# Patient Record
Sex: Female | Born: 1960 | Race: White | Hispanic: No | Smoking: Never smoker
Health system: Southern US, Community
[De-identification: ages and names within clinical notes are randomized; demographics above are authoritative.]

## PROBLEM LIST (undated history)

## (undated) DIAGNOSIS — Z9889 Other specified postprocedural states: Secondary | ICD-10-CM

## (undated) DIAGNOSIS — IMO0001 Reserved for inherently not codable concepts without codable children: Secondary | ICD-10-CM

## (undated) DIAGNOSIS — M1990 Unspecified osteoarthritis, unspecified site: Secondary | ICD-10-CM

## (undated) DIAGNOSIS — R519 Headache, unspecified: Secondary | ICD-10-CM

## (undated) DIAGNOSIS — K219 Gastro-esophageal reflux disease without esophagitis: Secondary | ICD-10-CM

## (undated) DIAGNOSIS — R51 Headache: Secondary | ICD-10-CM

## (undated) DIAGNOSIS — R112 Nausea with vomiting, unspecified: Secondary | ICD-10-CM

## (undated) DIAGNOSIS — T4145XA Adverse effect of unspecified anesthetic, initial encounter: Secondary | ICD-10-CM

## (undated) DIAGNOSIS — I499 Cardiac arrhythmia, unspecified: Secondary | ICD-10-CM

## (undated) DIAGNOSIS — T8859XA Other complications of anesthesia, initial encounter: Secondary | ICD-10-CM

## (undated) DIAGNOSIS — J40 Bronchitis, not specified as acute or chronic: Secondary | ICD-10-CM

## (undated) DIAGNOSIS — Z9189 Other specified personal risk factors, not elsewhere classified: Secondary | ICD-10-CM

## (undated) DIAGNOSIS — Z889 Allergy status to unspecified drugs, medicaments and biological substances status: Secondary | ICD-10-CM

## (undated) DIAGNOSIS — D649 Anemia, unspecified: Secondary | ICD-10-CM

## (undated) HISTORY — DX: Unspecified osteoarthritis, unspecified site: M19.90

## (undated) HISTORY — PX: INTRAUTERINE DEVICE INSERTION: SHX323

---

## 1998-04-02 ENCOUNTER — Other Ambulatory Visit: Admission: RE | Admit: 1998-04-02 | Discharge: 1998-04-02 | Payer: Self-pay | Admitting: *Deleted

## 1999-04-22 ENCOUNTER — Other Ambulatory Visit: Admission: RE | Admit: 1999-04-22 | Discharge: 1999-04-22 | Payer: Self-pay | Admitting: *Deleted

## 2000-08-28 ENCOUNTER — Other Ambulatory Visit: Admission: RE | Admit: 2000-08-28 | Discharge: 2000-08-28 | Payer: Self-pay | Admitting: *Deleted

## 2001-10-07 ENCOUNTER — Other Ambulatory Visit: Admission: RE | Admit: 2001-10-07 | Discharge: 2001-10-07 | Payer: Self-pay | Admitting: *Deleted

## 2002-03-22 ENCOUNTER — Encounter: Admission: RE | Admit: 2002-03-22 | Discharge: 2002-03-22 | Payer: Self-pay | Admitting: *Deleted

## 2002-03-22 ENCOUNTER — Encounter: Payer: Self-pay | Admitting: *Deleted

## 2004-03-11 ENCOUNTER — Other Ambulatory Visit: Admission: RE | Admit: 2004-03-11 | Discharge: 2004-03-11 | Payer: Self-pay | Admitting: *Deleted

## 2005-08-20 ENCOUNTER — Other Ambulatory Visit: Admission: RE | Admit: 2005-08-20 | Discharge: 2005-08-20 | Payer: Self-pay | Admitting: Obstetrics and Gynecology

## 2006-04-29 ENCOUNTER — Encounter: Admission: RE | Admit: 2006-04-29 | Discharge: 2006-04-29 | Payer: Self-pay | Admitting: Obstetrics and Gynecology

## 2007-12-30 HISTORY — PX: FRACTURE SURGERY: SHX138

## 2008-06-29 ENCOUNTER — Encounter: Admission: RE | Admit: 2008-06-29 | Discharge: 2008-06-29 | Payer: Self-pay | Admitting: Obstetrics and Gynecology

## 2009-10-09 ENCOUNTER — Encounter: Admission: RE | Admit: 2009-10-09 | Discharge: 2009-10-09 | Payer: Self-pay | Admitting: Obstetrics and Gynecology

## 2010-02-19 ENCOUNTER — Encounter: Admission: RE | Admit: 2010-02-19 | Discharge: 2010-02-19 | Payer: Self-pay | Admitting: Family Medicine

## 2010-03-31 ENCOUNTER — Emergency Department (HOSPITAL_COMMUNITY): Admission: EM | Admit: 2010-03-31 | Discharge: 2010-03-31 | Payer: Self-pay | Admitting: Emergency Medicine

## 2010-12-02 ENCOUNTER — Observation Stay (HOSPITAL_COMMUNITY)
Admission: EM | Admit: 2010-12-02 | Discharge: 2010-12-03 | Payer: Self-pay | Source: Home / Self Care | Attending: Internal Medicine | Admitting: Internal Medicine

## 2010-12-03 ENCOUNTER — Encounter: Payer: Self-pay | Admitting: Internal Medicine

## 2011-03-11 LAB — POCT CARDIAC MARKERS
Myoglobin, poc: 29.2 ng/mL (ref 12–200)
Troponin i, poc: <0.05 ng/mL (ref 0.00–0.09)

## 2011-03-11 LAB — CBC
MCH: 32.6 pg (ref 26.0–34.0)
MCHC: 33.8 g/dL (ref 30.0–36.0)

## 2011-03-11 LAB — DIFFERENTIAL
Basophils Relative: 1 % (ref 0–1)
Eosinophils Absolute: 0.1 10*3/uL (ref 0.0–0.7)
Lymphocytes Relative: 18 % (ref 12–46)
Monocytes Absolute: 0.3 10*3/uL (ref 0.1–1.0)
Neutro Abs: 3.8 10*3/uL (ref 1.7–7.7)
Neutrophils Relative %: 74 % (ref 43–77)

## 2011-03-11 LAB — POCT I-STAT, CHEM 8
BUN: 16 mg/dL (ref 6–23)
Chloride: 105 mEq/L (ref 96–112)
HCT: 41 % (ref 36.0–46.0)
Hemoglobin: 13.9 g/dL (ref 12.0–15.0)
Potassium: 3.8 mEq/L (ref 3.5–5.1)
TCO2: 25 mmol/L (ref 0–100)

## 2011-03-11 LAB — TROPONIN I: Troponin I: 0.01 ng/mL (ref 0.00–0.06)

## 2011-03-11 LAB — CARDIAC PANEL(CRET KIN+CKTOT+MB+TROPI)
CK, MB: 0.5 ng/mL (ref 0.3–4.0)
Total CK: 36 U/L (ref 7–177)

## 2011-03-11 LAB — CK TOTAL AND CKMB (NOT AT ARMC)
CK, MB: 0.5 ng/mL (ref 0.3–4.0)
Relative Index: INVALID (ref 0.0–2.5)
Total CK: 38 U/L (ref 7–177)

## 2011-09-15 ENCOUNTER — Other Ambulatory Visit: Payer: Self-pay | Admitting: Family Medicine

## 2011-09-15 ENCOUNTER — Other Ambulatory Visit: Payer: Self-pay | Admitting: Rehabilitation

## 2011-09-15 ENCOUNTER — Ambulatory Visit
Admission: RE | Admit: 2011-09-15 | Discharge: 2011-09-15 | Disposition: A | Discharge status: Home / Self Care | Payer: BC Managed Care – PPO | Source: Ambulatory Visit | Attending: Family Medicine | Admitting: Family Medicine

## 2011-10-13 ENCOUNTER — Other Ambulatory Visit: Payer: Self-pay | Admitting: Obstetrics and Gynecology

## 2011-10-14 ENCOUNTER — Other Ambulatory Visit: Payer: Self-pay | Admitting: Obstetrics and Gynecology

## 2011-10-14 DIAGNOSIS — Z1231 Encounter for screening mammogram for malignant neoplasm of breast: Secondary | ICD-10-CM

## 2011-10-21 ENCOUNTER — Ambulatory Visit
Admission: RE | Admit: 2011-10-21 | Discharge: 2011-10-21 | Disposition: A | Discharge status: Home / Self Care | Payer: BC Managed Care – PPO | Source: Ambulatory Visit | Attending: Obstetrics and Gynecology | Admitting: Obstetrics and Gynecology

## 2011-10-21 DIAGNOSIS — Z1231 Encounter for screening mammogram for malignant neoplasm of breast: Secondary | ICD-10-CM

## 2012-12-01 ENCOUNTER — Institutional Professional Consult (permissible substitution): Payer: BC Managed Care – PPO | Admitting: Cardiovascular Disease

## 2013-08-22 ENCOUNTER — Other Ambulatory Visit: Payer: Self-pay | Admitting: Obstetrics and Gynecology

## 2013-08-22 DIAGNOSIS — N644 Mastodynia: Secondary | ICD-10-CM

## 2013-10-10 ENCOUNTER — Other Ambulatory Visit: Payer: Self-pay

## 2013-10-10 DIAGNOSIS — Z1231 Encounter for screening mammogram for malignant neoplasm of breast: Secondary | ICD-10-CM

## 2013-11-03 ENCOUNTER — Ambulatory Visit
Admission: RE | Admit: 2013-11-03 | Discharge: 2013-11-03 | Disposition: A | Discharge status: Home / Self Care | Payer: BC Managed Care – PPO | Source: Ambulatory Visit

## 2013-11-03 DIAGNOSIS — Z1231 Encounter for screening mammogram for malignant neoplasm of breast: Secondary | ICD-10-CM

## 2014-07-12 ENCOUNTER — Other Ambulatory Visit: Payer: Self-pay | Admitting: Obstetrics and Gynecology

## 2014-07-12 DIAGNOSIS — N644 Mastodynia: Secondary | ICD-10-CM

## 2014-07-13 ENCOUNTER — Other Ambulatory Visit: Payer: Self-pay | Admitting: Obstetrics and Gynecology

## 2014-07-13 DIAGNOSIS — N644 Mastodynia: Secondary | ICD-10-CM

## 2014-08-03 ENCOUNTER — Other Ambulatory Visit: Payer: Self-pay | Admitting: Obstetrics and Gynecology

## 2014-08-03 ENCOUNTER — Ambulatory Visit
Admission: RE | Admit: 2014-08-03 | Discharge: 2014-08-03 | Disposition: A | Discharge status: Home / Self Care | Payer: BC Managed Care – PPO | Source: Ambulatory Visit | Attending: Obstetrics and Gynecology | Admitting: Obstetrics and Gynecology

## 2014-08-03 DIAGNOSIS — N644 Mastodynia: Secondary | ICD-10-CM

## 2014-10-06 ENCOUNTER — Ambulatory Visit (INDEPENDENT_AMBULATORY_CARE_PROVIDER_SITE_OTHER): Payer: Self-pay | Admitting: Surgery

## 2014-10-10 ENCOUNTER — Encounter: Payer: Self-pay | Admitting: *Deleted

## 2014-10-20 NOTE — Pre-Procedure Instructions (Signed)
Jenna Morgan  10/20/2014   Your procedure is scheduled on:  Wednesday, November 01, 2014  [facility] at 8:15 AM.  Call this number if you have problems the morning of surgery: [phone]   Remember:   Do not eat food or drink liquids after midnight Tuesday, October 31, 2014   Take these medicines the morning of surgery with A SIP OF WATER: ALPRAZolam Prudy Feeler(XANAX) if needed for anxiety, fluticasone (FLONASE) 50 MCG/ACT nasal spray, Claritin and Mucinex.  Stop taking Aspirin, vitamins, and herbal medications. Do not take any NSAIDs ie: Ibuprofen, Advil, Naproxen or any medication containing Aspirin; stop 5 days prior to procedure ( Friday, October 27, 2014).   Do not wear jewelry, make-up or nail polish.  Do not wear lotions, powders, or perfumes. You may not wear deodorant.  Do not shave 48 hours prior to surgery.  Do not bring valuables to the hospital.  [facility] is not responsible for any belongings or valuables.               Contacts, dentures or bridgework may not be worn into surgery.  Leave suitcase in the car. After surgery it may be brought to your room.  For patients admitted to the hospital, discharge time is determined by your treatment team.               Patients discharged the day of surgery will not be allowed to drive home.  Name and phone number of your driver:   Special Instructions:  Special Instructions:Special Instructions: [facility] - Preparing for Surgery  Before surgery, you can play an important role.  Because skin is not sterile, your skin needs to be as free of germs as possible.  You can reduce the number of germs on you skin by washing with CHG (chlorahexidine gluconate) soap before surgery.  CHG is an antiseptic cleaner which kills germs and bonds with the skin to continue killing germs even after washing.  Please DO NOT use if you have an allergy to CHG or antibacterial soaps.  If your skin becomes reddened/irritated stop  using the CHG and inform your nurse when you arrive at Short Stay.  Do not shave (including legs and underarms) for at least 48 hours prior to the first CHG shower.  You may shave your face.  Please follow these instructions carefully:   1.  Shower with CHG Soap the night before surgery and the morning of Surgery.  2.  If you choose to wash your hair, wash your hair first as usual with your normal shampoo.  3.  After you shampoo, rinse your hair and body thoroughly to remove the Shampoo.  4.  Use CHG as you would any other liquid soap.  You can apply chg directly  to the skin and wash gently with scrungie or a clean washcloth.  5.  Apply the CHG Soap to your body ONLY FROM THE NECK DOWN.  Do not use on open wounds or open sores.  Avoid contact with your eyes, ears, mouth and genitals (private parts).  Wash genitals (private parts) with your normal soap.  6.  Wash thoroughly, paying special attention to the area where your surgery will be performed.  7.  Thoroughly rinse your body with warm water from the neck down.  8.  DO NOT shower/wash with your normal soap after using and rinsing off the CHG Soap.  9.  Pat yourself dry with a clean towel.  10.  Wear clean pajamas.            11.  Place clean sheets on your bed the night of your first shower and do not sleep with pets.  Day of Surgery  Do not apply any lotions/deodorants the morning of surgery.  Please wear clean clothes to the hospital/surgery center.   Please read over the following fact sheets that you were given: Pain Booklet, Coughing and Deep Breathing and Surgical Site Infection Prevention

## 2014-10-23 ENCOUNTER — Encounter (HOSPITAL_COMMUNITY): Payer: Self-pay | Admitting: Pharmacy Technician

## 2014-10-23 ENCOUNTER — Encounter (HOSPITAL_COMMUNITY)
Admission: RE | Admit: 2014-10-23 | Discharge: 2014-10-23 | Disposition: A | Discharge status: Home / Self Care | Payer: BC Managed Care – PPO | Source: Ambulatory Visit | Attending: Anesthesiology | Admitting: Anesthesiology

## 2014-10-23 ENCOUNTER — Encounter (HOSPITAL_COMMUNITY)
Admission: RE | Admit: 2014-10-23 | Discharge: 2014-10-23 | Disposition: A | Discharge status: Home / Self Care | Payer: BC Managed Care – PPO | Source: Ambulatory Visit | Attending: Surgery | Admitting: Surgery

## 2014-10-23 ENCOUNTER — Encounter (HOSPITAL_COMMUNITY): Payer: Self-pay

## 2014-10-23 DIAGNOSIS — K219 Gastro-esophageal reflux disease without esophagitis: Secondary | ICD-10-CM | POA: Diagnosis not present

## 2014-10-23 DIAGNOSIS — R000 Tachycardia, unspecified: Secondary | ICD-10-CM | POA: Diagnosis not present

## 2014-10-23 DIAGNOSIS — M1990 Unspecified osteoarthritis, unspecified site: Secondary | ICD-10-CM | POA: Diagnosis not present

## 2014-10-23 DIAGNOSIS — Z01818 Encounter for other preprocedural examination: Secondary | ICD-10-CM | POA: Insufficient documentation

## 2014-10-23 DIAGNOSIS — I447 Left bundle-branch block, unspecified: Secondary | ICD-10-CM | POA: Insufficient documentation

## 2014-10-23 HISTORY — DX: Nausea with vomiting, unspecified: R11.2

## 2014-10-23 HISTORY — DX: Headache: R51

## 2014-10-23 HISTORY — DX: Adverse effect of unspecified anesthetic, initial encounter: T41.45XA

## 2014-10-23 HISTORY — DX: Other complications of anesthesia, initial encounter: T88.59XA

## 2014-10-23 HISTORY — DX: Gastro-esophageal reflux disease without esophagitis: K21.9

## 2014-10-23 HISTORY — DX: Reserved for inherently not codable concepts without codable children: IMO0001

## 2014-10-23 HISTORY — DX: Headache, unspecified: R51.9

## 2014-10-23 HISTORY — DX: Allergy status to unspecified drugs, medicaments and biological substances: Z88.9

## 2014-10-23 HISTORY — DX: Bronchitis, not specified as acute or chronic: J40

## 2014-10-23 HISTORY — DX: Anemia, unspecified: D64.9

## 2014-10-23 HISTORY — DX: Cardiac arrhythmia, unspecified: I49.9

## 2014-10-23 HISTORY — DX: Other specified personal risk factors, not elsewhere classified: Z91.89

## 2014-10-23 HISTORY — DX: Other specified postprocedural states: Z98.890

## 2014-10-23 LAB — BASIC METABOLIC PANEL
ANION GAP: 11 (ref 5–15)
BUN: 19 mg/dL (ref 6–23)
CHLORIDE: 105 meq/L (ref 96–112)
CO2: 26 meq/L (ref 19–32)
Calcium: 9.6 mg/dL (ref 8.4–10.5)
Creatinine, Ser: 0.83 mg/dL (ref 0.50–1.10)
GFR calc Af Amer: >90 mL/min (ref >90)
GFR calc non Af Amer: 81 mL/min — ABNORMAL LOW (ref >90)
Glucose, Bld: 53 mg/dL — ABNORMAL LOW (ref 70–99)
POTASSIUM: 4.2 meq/L (ref 3.7–5.3)
Sodium: 142 mEq/L (ref 137–147)

## 2014-10-23 LAB — CBC
HEMATOCRIT: 39.9 % (ref 36.0–46.0)
HEMOGLOBIN: 13.3 g/dL (ref 12.0–15.0)
MCH: 32.6 pg (ref 26.0–34.0)
MCHC: 33.3 g/dL (ref 30.0–36.0)
MCV: 97.8 fL (ref 78.0–100.0)
Platelets: 288 10*3/uL (ref 150–400)
RBC: 4.08 MIL/uL (ref 3.87–5.11)
RDW: 11.8 % (ref 11.5–15.5)
WBC: 4.4 10*3/uL (ref 4.0–10.5)

## 2014-10-23 LAB — HCG, SERUM, QUALITATIVE: PREG SERUM: NEGATIVE

## 2014-10-23 MED ORDER — CHLORHEXIDINE GLUCONATE 4 % EX LIQD: 1.0000 "application " | Freq: Once | CUTANEOUS | Status: DC

## 2014-10-24 ENCOUNTER — Encounter (HOSPITAL_COMMUNITY): Payer: Self-pay | Admitting: Vascular Surgery

## 2014-10-24 NOTE — Progress Notes (Signed)
Anesthesia Chart Review:  Patient is a 53 year old female scheduled for left excisional breast biopsy on 11/01/14 by Dr. Corliss Skainssuei.  History includes non-smoker, left BBB, tachycardia, post-operative N/V, GERD, osteoarthritis. PCP is listed as Dr. Catha GosselinKevin Little. She was seen by cardiologist Dr. Peter SwazilandJordan in 11/2010 during a hospitalization for atypical chest pain with new onset left BBB.  She had a normal stress test then.   EKG on 10/23/14 showed: NSR, left BBB.  No significant change since last tracing on 12/02/10.  She had a normal Lexiscan study, EF 59% on 12/03/10.  Echo on 12/03/10: Normal LV cavity size and thickness. Normal LV systolic function, EF 55-60%. Normal wall motion, no regional wall motion abnormalities. Ventricular septum: Septal motion showed paradox.  No ASD or PFO noted.    Preoperative CXR and labs noted.   Patient with known chronic left BBB with previous unremarkable cardiac work-up.  If no acute changes or new CV symptoms then I would anticipate that she could proceed as planned.  Velna Ochsllison Aideen Fenster, PA-C [facility]/Anesthesiology Phone [phone] 10/24/2014 3:52 PM

## 2014-10-27 ENCOUNTER — Telehealth (INDEPENDENT_AMBULATORY_CARE_PROVIDER_SITE_OTHER): Payer: Self-pay

## 2014-10-27 NOTE — Telephone Encounter (Signed)
Pt called to let Dr Corliss Skainssuei that her breast mass, is still there however it has gotten 85% better and she wanted to know if she still needed to have her sx. Pt is scheduled to have sx on 11/4.  Pt states that the area is not very painful any longer. Informed pt that I would send Dr Corliss Skainssuei a message and we would contact her as soon as we recieve a message. Will put this message in allscripts as well

## 2014-10-30 ENCOUNTER — Other Ambulatory Visit: Payer: Self-pay | Admitting: Obstetrics and Gynecology

## 2014-10-30 DIAGNOSIS — Z1231 Encounter for screening mammogram for malignant neoplasm of breast: Secondary | ICD-10-CM

## 2014-11-01 ENCOUNTER — Ambulatory Visit (HOSPITAL_COMMUNITY): Admission: RE | Admit: 2014-11-01 | Payer: BC Managed Care – PPO | Source: Ambulatory Visit | Admitting: Surgery

## 2014-11-01 ENCOUNTER — Encounter (HOSPITAL_COMMUNITY): Admission: RE | Payer: Self-pay | Source: Ambulatory Visit

## 2014-11-01 SURGERY — EXCISION OF BREAST BIOPSY
Anesthesia: General | Laterality: Left

## 2014-11-08 ENCOUNTER — Inpatient Hospital Stay: Admission: RE | Admit: 2014-11-08 | Payer: BC Managed Care – PPO | Source: Ambulatory Visit

## 2014-11-08 ENCOUNTER — Other Ambulatory Visit: Payer: Self-pay | Admitting: Obstetrics and Gynecology

## 2014-11-08 DIAGNOSIS — Z1231 Encounter for screening mammogram for malignant neoplasm of breast: Secondary | ICD-10-CM

## 2014-11-16 ENCOUNTER — Other Ambulatory Visit: Payer: Self-pay | Admitting: Family Medicine

## 2014-11-16 DIAGNOSIS — N63 Unspecified lump in unspecified breast: Secondary | ICD-10-CM

## 2014-12-07 ENCOUNTER — Ambulatory Visit
Admission: RE | Admit: 2014-12-07 | Discharge: 2014-12-07 | Disposition: A | Discharge status: Home / Self Care | Payer: BC Managed Care – PPO | Source: Ambulatory Visit | Attending: Family Medicine | Admitting: Family Medicine

## 2014-12-07 DIAGNOSIS — N63 Unspecified lump in unspecified breast: Secondary | ICD-10-CM

## 2016-03-17 ENCOUNTER — Other Ambulatory Visit: Payer: Self-pay

## 2016-03-17 DIAGNOSIS — Z1231 Encounter for screening mammogram for malignant neoplasm of breast: Secondary | ICD-10-CM

## 2016-04-09 ENCOUNTER — Other Ambulatory Visit: Payer: Self-pay

## 2016-04-09 ENCOUNTER — Ambulatory Visit
Admission: RE | Admit: 2016-04-09 | Discharge: 2016-04-09 | Disposition: A | Discharge status: Home / Self Care | Payer: BC Managed Care – PPO | Source: Ambulatory Visit

## 2016-04-09 DIAGNOSIS — Z1231 Encounter for screening mammogram for malignant neoplasm of breast: Secondary | ICD-10-CM

## 2017-06-10 ENCOUNTER — Other Ambulatory Visit: Payer: Self-pay | Admitting: Obstetrics and Gynecology

## 2017-06-10 DIAGNOSIS — Z1231 Encounter for screening mammogram for malignant neoplasm of breast: Secondary | ICD-10-CM

## 2017-07-07 ENCOUNTER — Encounter (INDEPENDENT_AMBULATORY_CARE_PROVIDER_SITE_OTHER): Payer: Self-pay

## 2017-07-07 ENCOUNTER — Ambulatory Visit
Admission: RE | Admit: 2017-07-07 | Discharge: 2017-07-07 | Disposition: A | Discharge status: Home / Self Care | Payer: BC Managed Care – PPO | Source: Ambulatory Visit | Attending: Obstetrics and Gynecology | Admitting: Obstetrics and Gynecology

## 2017-07-07 DIAGNOSIS — Z1231 Encounter for screening mammogram for malignant neoplasm of breast: Secondary | ICD-10-CM

## 2018-08-10 ENCOUNTER — Other Ambulatory Visit: Payer: Self-pay | Admitting: Obstetrics and Gynecology

## 2018-08-10 DIAGNOSIS — Z1231 Encounter for screening mammogram for malignant neoplasm of breast: Secondary | ICD-10-CM

## 2018-10-06 ENCOUNTER — Ambulatory Visit
Admission: RE | Admit: 2018-10-06 | Discharge: 2018-10-06 | Disposition: A | Discharge status: Home / Self Care | Payer: BC Managed Care – PPO | Source: Ambulatory Visit | Attending: Obstetrics and Gynecology | Admitting: Obstetrics and Gynecology

## 2018-10-06 DIAGNOSIS — Z1231 Encounter for screening mammogram for malignant neoplasm of breast: Secondary | ICD-10-CM

## 2019-06-10 ENCOUNTER — Other Ambulatory Visit: Payer: Self-pay | Admitting: Orthopedic Surgery

## 2019-06-10 DIAGNOSIS — G8929 Other chronic pain: Secondary | ICD-10-CM

## 2019-07-04 ENCOUNTER — Other Ambulatory Visit: Payer: BC Managed Care – PPO

## 2020-01-25 ENCOUNTER — Other Ambulatory Visit: Payer: Self-pay | Admitting: Obstetrics and Gynecology

## 2020-01-25 DIAGNOSIS — Z1231 Encounter for screening mammogram for malignant neoplasm of breast: Secondary | ICD-10-CM

## 2020-01-27 ENCOUNTER — Other Ambulatory Visit: Payer: Self-pay | Admitting: Family Medicine

## 2020-01-27 DIAGNOSIS — Z8249 Family history of ischemic heart disease and other diseases of the circulatory system: Secondary | ICD-10-CM

## 2020-02-03 ENCOUNTER — Other Ambulatory Visit: Payer: BC Managed Care – PPO

## 2020-03-01 ENCOUNTER — Ambulatory Visit: Payer: BC Managed Care – PPO

## 2020-03-02 ENCOUNTER — Encounter: Payer: Self-pay | Admitting: Neurology

## 2020-03-05 ENCOUNTER — Other Ambulatory Visit: Payer: Self-pay

## 2020-03-05 ENCOUNTER — Ambulatory Visit
Admission: RE | Admit: 2020-03-05 | Discharge: 2020-03-05 | Disposition: A | Discharge status: Home / Self Care | Payer: BC Managed Care – PPO | Source: Ambulatory Visit | Attending: Obstetrics and Gynecology | Admitting: Obstetrics and Gynecology

## 2020-03-05 DIAGNOSIS — Z1231 Encounter for screening mammogram for malignant neoplasm of breast: Secondary | ICD-10-CM

## 2020-03-15 ENCOUNTER — Encounter: Payer: Self-pay | Admitting: *Deleted

## 2020-03-16 ENCOUNTER — Encounter: Payer: Self-pay | Admitting: Neurology

## 2020-03-16 ENCOUNTER — Other Ambulatory Visit: Payer: Self-pay

## 2020-03-16 ENCOUNTER — Ambulatory Visit: Payer: BC Managed Care – PPO | Admitting: Neurology

## 2020-03-16 VITALS — BP 114/71 | HR 82 | Temp 97.8°F | Ht 67.5 in | Wt 177.0 lb

## 2020-03-16 DIAGNOSIS — G514 Facial myokymia: Secondary | ICD-10-CM | POA: Diagnosis not present

## 2020-03-16 NOTE — Progress Notes (Signed)
PATIENT: Jenna Morgan DOB: Jan 29, 1961  Chief Complaint  Patient presents with  . New Patient (Initial Visit)    pt alone, rm 4. pt states that when she smiles a certain way her face will tremor. she noticed this about a month ago.   . Other    Referring  NP Christa See, NP working with Dr Rex Kras.     HISTORICAL  Jenna Morgan is a 59 year old female, seen in request by her primary care physician Dr. Hulan Fess for evaluation of bilateral facial muscle tremor 1 small in certain position, initial evaluation was on March 16, 2020.  I have reviewed and summarized the referring note from the referring physician.  She has osteoporosis, taking alendronate, otherwise fairly healthy, around February 2021, she was noted by her friends, when she smiles in certain position, she had bilateral facial tremor, it would disappear, if she gave a full smile, or stop smiling, she denies facial sensory loss, denies hearing loss, visual loss, no dysarthria, no dysphagia.  No generalized limb muscle weakness   REVIEW OF SYSTEMS: Full 14 system review of systems performed and notable only for as above All other review of systems were negative.  ALLERGIES: No Known Allergies  HOME MEDICATIONS: Current Outpatient Medications  Medication Sig Dispense Refill  . alendronate (FOSAMAX) 70 MG tablet alendronate 70 mg tablet    . ALPRAZolam (XANAX) 0.5 MG tablet Take 0.5 mg by mouth as needed for anxiety (only when she flies.).     Marland Kitchen Ascorbic Acid (VITAMIN C) 1000 MG tablet Take 1,000 mg by mouth daily.    . Multiple Minerals-Vitamins (NUTRA-SUPPORT BONE PO) Take 3 tablets by mouth daily.    . Omega-3 Fatty Acids (OMEGA-3 FISH OIL PO) Take 2 tablets by mouth daily.    . Probiotic Product (PROBIOTIC ADVANCED PO) Take 1 tablet by mouth daily.    . Zinc Acetate, Oral, (ZINC ACETATE PO) Take 1 tablet by mouth daily.     No current facility-administered medications for this visit.    PAST MEDICAL  HISTORY: Past Medical History:  Diagnosis Date  . Anemia   . Bronchitis   . Cold    getting over a cold  . Complication of anesthesia   . Dysrhythmia    LBBB  . Dysrhythmia, cardiac    heart races at times, tachycardia  . GERD (gastroesophageal reflux disease)    at times  . H/O multiple allergies   . Headache    sinus and migraines  . Osteoarthritis   . PONV (postoperative nausea and vomiting)     PAST SURGICAL HISTORY: Past Surgical History:  Procedure Laterality Date  . FRACTURE SURGERY Right 2009   ORIF arm  . INTRAUTERINE DEVICE INSERTION      FAMILY HISTORY: Family History  Problem Relation Age of Onset  . Migraines Mother   . COPD Mother   . Heart attack Father   . Hypertension Father   . Ulcers Father   . Alcoholism Father   . Asthma Sister   . Aneurysm Paternal Aunt   . Alzheimer's disease Maternal Grandmother   . Cervical cancer Maternal Grandmother   . Aneurysm Paternal Grandmother   . Heart disease Paternal Grandfather     SOCIAL HISTORY: Social History   Socioeconomic History  . Marital status: Single    Spouse name: Not on file  . Number of children: Not on file  . Years of education: Not on file  . Highest education level: Not on file  Occupational History  . Not on file  Tobacco Use  . Smoking status: Never Smoker  . Smokeless tobacco: Never Used  Substance and Sexual Activity  . Alcohol use: Yes    Comment: ocassionally  . Drug use: No  . Sexual activity: Not on file  Other Topics Concern  . Not on file  Social History Narrative   Caffeine very little, children 2, Employed Runner, broadcasting/film/video at [employer].    Social Determinants of Health   Financial Resource Strain:   . Difficulty of Paying Living Expenses:   Food Insecurity:   . Worried About Programme researcher, broadcasting/film/video in the Last Year:   . Barista in the Last Year:   Transportation Needs:   . Freight forwarder (Medical):   Marland Kitchen Lack of Transportation (Non-Medical):   Physical  Activity:   . Days of Exercise per Week:   . Minutes of Exercise per Session:   Stress:   . Feeling of Stress :   Social Connections:   . Frequency of Communication with Friends and Family:   . Frequency of Social Gatherings with Friends and Family:   . Attends Religious Services:   . Active Member of Clubs or Organizations:   . Attends Banker Meetings:   Marland Kitchen Marital Status:   Intimate Partner Violence:   . Fear of Current or Ex-Partner:   . Emotionally Abused:   Marland Kitchen Physically Abused:   . Sexually Abused:      PHYSICAL EXAM   Vitals:   03/16/20 0821  BP: 114/71  Pulse: 82  Temp: 97.8 F (36.6 C)  Weight: 177 lb (80.3 kg)  Height: 5' 7.5" (1.715 m)    Not recorded      Body mass index is 27.31 kg/m.  PHYSICAL EXAMNIATION:  Gen: NAD, conversant, well nourised, well groomed                     Cardiovascular: Regular rate rhythm, no peripheral edema, warm, nontender. Eyes: Conjunctivae clear without exudates or hemorrhage Neck: Supple, no carotid bruits. Pulmonary: Clear to auscultation bilaterally   NEUROLOGICAL EXAM:  MENTAL STATUS: Speech:    Speech is normal; fluent and spontaneous with normal comprehension.  Cognition:     Orientation to time, place and person     Normal recent and remote memory     Normal Attention span and concentration     Normal Language, naming, repeating,spontaneous speech     Fund of knowledge   CRANIAL NERVES: CN II: Visual fields are full to confrontation. Pupils are round equal and briskly reactive to light. CN III, IV, VI: extraocular movement are normal. No ptosis. CN V: Facial sensation is intact to light touch, bilateral corneal reflexes were symmetric. CN VII: Face is symmetric with normal eye closure, when she smiled halfway, there was intermittent bilateral facial cheek muscle tremor, that would stop if she gave a full smile, or stop smiling.  The tremor were symmetric bilaterally CN VIII: Hearing is normal  to causal conversation. CN IX, X: Phonation is normal. CN XI: Head turning and shoulder shrug are intact  MOTOR: There is no pronator drift of out-stretched arms. Muscle bulk and tone are normal. Muscle strength is normal.  REFLEXES: Reflexes are 2+ and symmetric at the biceps, triceps, knees, and ankles. Plantar responses are flexor.  SENSORY: Intact to light touch, pinprick and vibratory sensation are intact in fingers and toes.  COORDINATION: There is no trunk or limb dysmetria  noted.  GAIT/STANCE: Posture is normal. Gait is steady with normal steps, base, arm swing, and turning. Heel and toe walking are normal. Tandem gait is normal.  Romberg is absent.   DIAGNOSTIC DATA (LABS, IMAGING, TESTING) - I reviewed patient records, labs, notes, testing and imaging myself where available.   ASSESSMENT AND PLAN  Mckala Pantaleon is a 59 y.o. female   Bilateral facial tremor,  Related to halfway exertion of the muscle, essentially normal neurological examination, likely a normal phenomenon  I do not think imaging studies warranted at this point, she has pending preventive yearly checkup in July 2021  She reported her strong family history of brain aneurysm, paternal grandmother died of brain aneurysm at age 32s, paternal aunt suffered brain aneurysm 83s, paternal cousin also had a brain aneurysm in 40s.  She denies a history of headaches or focal symptoms.   Levert Feinstein, M.D. Ph.D.  [facility] [address] [place], [place] [address] Ph: [phone] Fax: [phone]  CC: Catha Gosselin, MD

## 2020-03-29 ENCOUNTER — Ambulatory Visit: Payer: BC Managed Care – PPO

## 2020-04-09 ENCOUNTER — Ambulatory Visit: Payer: BC Managed Care – PPO | Admitting: Neurology

## 2020-04-13 ENCOUNTER — Telehealth: Payer: Self-pay | Admitting: Neurology

## 2020-04-13 DIAGNOSIS — Z8249 Family history of ischemic heart disease and other diseases of the circulatory system: Secondary | ICD-10-CM

## 2020-04-13 DIAGNOSIS — G514 Facial myokymia: Secondary | ICD-10-CM

## 2020-04-13 NOTE — Telephone Encounter (Signed)
Pt called stating she has decided to go ahead with the MRI. Please call pt when available.

## 2020-04-16 NOTE — Telephone Encounter (Signed)
I have ordered MRI of the brain with without contrast, she has strong family history of brain aneurysm.

## 2020-04-16 NOTE — Addendum Note (Signed)
Addended by: Levert Feinstein on: 04/16/2020 10:14 AM   Modules accepted: Orders

## 2020-04-17 NOTE — Telephone Encounter (Signed)
no to the covid questions MR Brain w/wo contrast Dr. Mertie Clause Auth: [id number] (exp. 04/17/20 to 10/13/20). Patient is scheduled at [facility] for 04/25/20.

## 2020-04-25 ENCOUNTER — Other Ambulatory Visit: Payer: Self-pay

## 2020-04-25 ENCOUNTER — Ambulatory Visit: Payer: BC Managed Care – PPO

## 2020-04-25 DIAGNOSIS — G514 Facial myokymia: Secondary | ICD-10-CM

## 2020-04-25 MED ORDER — GADOBENATE DIMEGLUMINE 529 MG/ML IV SOLN
15.0000 mL | Freq: Once | INTRAVENOUS | Status: AC | PRN
  Administered 2020-04-25: 08:00:00 15 mL via INTRAVENOUS

## 2020-04-30 ENCOUNTER — Telehealth: Payer: Self-pay | Admitting: *Deleted

## 2020-04-30 NOTE — Telephone Encounter (Signed)
I spoke to the patient to provide her with the MRI brain results. She verbalized understanding of the findings.

## 2020-04-30 NOTE — Telephone Encounter (Signed)
-----   Message from Levert Feinstein, MD sent at 04/30/2020 10:54 AM EDT ----- Please call pt for normal MRI brain.

## 2021-01-03 ENCOUNTER — Institutional Professional Consult (permissible substitution): Payer: BC Managed Care – PPO | Admitting: Neurology

## 2021-01-23 ENCOUNTER — Ambulatory Visit: Payer: BC Managed Care – PPO | Admitting: Neurology

## 2021-03-18 ENCOUNTER — Telehealth: Payer: Self-pay | Admitting: *Deleted

## 2021-03-18 ENCOUNTER — Institutional Professional Consult (permissible substitution): Payer: BC Managed Care – PPO | Admitting: Neurology

## 2021-03-18 ENCOUNTER — Encounter: Payer: Self-pay | Admitting: Neurology

## 2021-03-18 NOTE — Telephone Encounter (Signed)
No showed consult appointment. 

## 2021-03-21 ENCOUNTER — Other Ambulatory Visit: Payer: Self-pay | Admitting: Obstetrics and Gynecology

## 2021-03-21 DIAGNOSIS — Z1231 Encounter for screening mammogram for malignant neoplasm of breast: Secondary | ICD-10-CM

## 2021-05-17 ENCOUNTER — Other Ambulatory Visit: Payer: Self-pay

## 2021-05-17 ENCOUNTER — Ambulatory Visit
Admission: RE | Admit: 2021-05-17 | Discharge: 2021-05-17 | Disposition: A | Discharge status: Home / Self Care | Payer: BC Managed Care – PPO | Source: Ambulatory Visit | Attending: Obstetrics and Gynecology | Admitting: Obstetrics and Gynecology

## 2021-05-17 DIAGNOSIS — Z1231 Encounter for screening mammogram for malignant neoplasm of breast: Secondary | ICD-10-CM

## 2022-01-13 ENCOUNTER — Ambulatory Visit: Payer: BC Managed Care – PPO | Admitting: Podiatry

## 2022-01-13 ENCOUNTER — Ambulatory Visit (INDEPENDENT_AMBULATORY_CARE_PROVIDER_SITE_OTHER): Payer: BC Managed Care – PPO

## 2022-01-13 ENCOUNTER — Other Ambulatory Visit: Payer: Self-pay

## 2022-01-13 DIAGNOSIS — M25571 Pain in right ankle and joints of right foot: Secondary | ICD-10-CM

## 2022-01-13 DIAGNOSIS — M79672 Pain in left foot: Secondary | ICD-10-CM | POA: Diagnosis not present

## 2022-01-13 DIAGNOSIS — M25572 Pain in left ankle and joints of left foot: Secondary | ICD-10-CM | POA: Diagnosis not present

## 2022-01-13 DIAGNOSIS — M79671 Pain in right foot: Secondary | ICD-10-CM | POA: Diagnosis not present

## 2022-01-13 DIAGNOSIS — M722 Plantar fascial fibromatosis: Secondary | ICD-10-CM | POA: Diagnosis not present

## 2022-01-13 DIAGNOSIS — M7730 Calcaneal spur, unspecified foot: Secondary | ICD-10-CM | POA: Diagnosis not present

## 2022-01-13 MED ORDER — DICLOFENAC SODIUM 1 % EX GEL: 2.0000 g | Freq: Four times a day (QID) | CUTANEOUS | 2 refills | Status: DC

## 2022-01-13 NOTE — Patient Instructions (Signed)
For instructions on how to put on your Night Splint, please visit [email or web address]   Plantar Fasciitis (Heel Spur Syndrome) with Rehab The plantar fascia is a fibrous, ligament-like, soft-tissue structure that spans the bottom of the foot. Plantar fasciitis is a condition that causes pain in the foot due to inflammation of the tissue. SYMPTOMS   Pain and tenderness on the underneath side of the foot.  Pain that worsens with standing or walking. CAUSES  Plantar fasciitis is caused by irritation and injury to the plantar fascia on the underneath side of the foot. Common mechanisms of injury include:  Direct trauma to bottom of the foot.  Damage to a small nerve that runs under the foot where the main fascia attaches to the heel bone.  Stress placed on the plantar fascia due to bone spurs. RISK INCREASES WITH:   Activities that place stress on the plantar fascia (running, jumping, pivoting, or cutting).  Poor strength and flexibility.  Improperly fitted shoes.  Tight calf muscles.  Flat feet.  Failure to warm-up properly before activity.  Obesity. PREVENTION  Warm up and stretch properly before activity.  Allow for adequate recovery between workouts.  Maintain physical fitness:  Strength, flexibility, and endurance.  Cardiovascular fitness.  Maintain a health body weight.  Avoid stress on the plantar fascia.  Wear properly fitted shoes, including arch supports for individuals who have flat feet.  PROGNOSIS  If treated properly, then the symptoms of plantar fasciitis usually resolve without surgery. However, occasionally surgery is necessary.  RELATED COMPLICATIONS   Recurrent symptoms that may result in a chronic condition.  Problems of the lower back that are caused by compensating for the injury, such as limping.  Pain or weakness of the foot during push-off following surgery.  Chronic inflammation, scarring, and partial or complete fascia tear,  occurring more often from repeated injections.  TREATMENT  Treatment initially involves the use of ice and medication to help reduce pain and inflammation. The use of strengthening and stretching exercises may help reduce pain with activity, especially stretches of the Achilles tendon. These exercises may be performed at home or with a therapist. Your caregiver may recommend that you use heel cups of arch supports to help reduce stress on the plantar fascia. Occasionally, corticosteroid injections are given to reduce inflammation. If symptoms persist for greater than 6 months despite non-surgical (conservative), then surgery may be recommended.   MEDICATION   If pain medication is necessary, then nonsteroidal anti-inflammatory medications, such as aspirin and ibuprofen, or other minor pain relievers, such as acetaminophen, are often recommended.  Do not take pain medication within 7 days before surgery.  Prescription pain relievers may be given if deemed necessary by your caregiver. Use only as directed and only as much as you need.  Corticosteroid injections may be given by your caregiver. These injections should be reserved for the most serious cases, because they may only be given a certain number of times.  HEAT AND COLD  Cold treatment (icing) relieves pain and reduces inflammation. Cold treatment should be applied for 10 to 15 minutes every 2 to 3 hours for inflammation and pain and immediately after any activity that aggravates your symptoms. Use ice packs or massage the area with a piece of ice (ice massage).  Heat treatment may be used prior to performing the stretching and strengthening activities prescribed by your caregiver, physical therapist, or athletic trainer. Use a heat pack or soak the injury in warm water.  SEEK IMMEDIATE MEDICAL CARE   IF:  Treatment seems to offer no benefit, or the condition worsens.  Any medications produce adverse side effects.  EXERCISES- RANGE OF  MOTION (ROM) AND STRETCHING EXERCISES - Plantar Fasciitis (Heel Spur Syndrome) These exercises may help you when beginning to rehabilitate your injury. Your symptoms may resolve with or without further involvement from your physician, physical therapist or athletic trainer. While completing these exercises, remember:   Restoring tissue flexibility helps normal motion to return to the joints. This allows healthier, less painful movement and activity.  An effective stretch should be held for at least 30 seconds.  A stretch should never be painful. You should only feel a gentle lengthening or release in the stretched tissue.  RANGE OF MOTION - Toe Extension, Flexion  Sit with your right / left leg crossed over your opposite knee.  Grasp your toes and gently pull them back toward the top of your foot. You should feel a stretch on the bottom of your toes and/or foot.  Hold this stretch for 10 seconds.  Now, gently pull your toes toward the bottom of your foot. You should feel a stretch on the top of your toes and or foot.  Hold this stretch for 10 seconds. Repeat  times. Complete this stretch 3 times per day.   RANGE OF MOTION - Ankle Dorsiflexion, Active Assisted  Remove shoes and sit on a chair that is preferably not on a carpeted surface.  Place right / left foot under knee. Extend your opposite leg for support.  Keeping your heel down, slide your right / left foot back toward the chair until you feel a stretch at your ankle or calf. If you do not feel a stretch, slide your bottom forward to the edge of the chair, while still keeping your heel down.  Hold this stretch for 10 seconds. Repeat 3 times. Complete this stretch 2 times per day.   STRETCH  Gastroc, Standing  Place hands on wall.  Extend right / left leg, keeping the front knee somewhat bent.  Slightly point your toes inward on your back foot.  Keeping your right / left heel on the floor and your knee straight, shift  your weight toward the wall, not allowing your back to arch.  You should feel a gentle stretch in the right / left calf. Hold this position for 10 seconds. Repeat 3 times. Complete this stretch 2 times per day.  STRETCH  Soleus, Standing  Place hands on wall.  Extend right / left leg, keeping the other knee somewhat bent.  Slightly point your toes inward on your back foot.  Keep your right / left heel on the floor, bend your back knee, and slightly shift your weight over the back leg so that you feel a gentle stretch deep in your back calf.  Hold this position for 10 seconds. Repeat 3 times. Complete this stretch 2 times per day.  STRETCH  Gastrocsoleus, Standing  Note: This exercise can place a lot of stress on your foot and ankle. Please complete this exercise only if specifically instructed by your caregiver.   Place the ball of your right / left foot on a step, keeping your other foot firmly on the same step.  Hold on to the wall or a rail for balance.  Slowly lift your other foot, allowing your body weight to press your heel down over the edge of the step.  You should feel a stretch in your right / left calf.  Hold this position   for 10 seconds.  Repeat this exercise with a slight bend in your right / left knee. Repeat 3 times. Complete this stretch 2 times per day.   STRENGTHENING EXERCISES - Plantar Fasciitis (Heel Spur Syndrome)  These exercises may help you when beginning to rehabilitate your injury. They may resolve your symptoms with or without further involvement from your physician, physical therapist or athletic trainer. While completing these exercises, remember:   Muscles can gain both the endurance and the strength needed for everyday activities through controlled exercises.  Complete these exercises as instructed by your physician, physical therapist or athletic trainer. Progress the resistance and repetitions only as guided.  STRENGTH - Towel Curls  Sit in  a chair positioned on a non-carpeted surface.  Place your foot on a towel, keeping your heel on the floor.  Pull the towel toward your heel by only curling your toes. Keep your heel on the floor. Repeat 3 times. Complete this exercise 2 times per day.  STRENGTH - Ankle Inversion  Secure one end of a rubber exercise band/tubing to a fixed object (table, pole). Loop the other end around your foot just before your toes.  Place your fists between your knees. This will focus your strengthening at your ankle.  Slowly, pull your big toe up and in, making sure the band/tubing is positioned to resist the entire motion.  Hold this position for 10 seconds.  Have your muscles resist the band/tubing as it slowly pulls your foot back to the starting position. Repeat 3 times. Complete this exercises 2 times per day.  Document Released: 12/15/2005 Document Revised: 03/08/2012 Document Reviewed: 03/29/2009 [employer] Patient Information 2014 [employer].  

## 2022-01-15 NOTE — Progress Notes (Signed)
Subjective:   Patient ID: Jenna Morgan, female   DOB: 61 y.o.   MRN: [id number]   HPI 61 year old female presents the office today for concerns of bilateral foot pain which is been ongoing for last 12 years but seems the worst has been intermittent.  Left side is about equal to the right for the most part.  Most of her pain to the morning when she first gets up or if she sits and stands back up or if she were wearing flat shoes.  No recent injury.  At times will radiate into her ankles.  No recent treatment.  No radiating pain or weakness.    She did have a wound on the left posterior heel and she was treated at orthopedics for this.  She is felt some occasional swelling and signs and some sharp pain, lateral aspect of foot but she notes this is coming from the injury.  No other concerns.   Review of Systems  All other systems reviewed and are negative.  Past Medical History:  Diagnosis Date   Anemia    Bronchitis    Cold    getting over a cold   Complication of anesthesia    Dysrhythmia    LBBB   Dysrhythmia, cardiac    heart races at times, tachycardia   GERD (gastroesophageal reflux disease)    at times   H/O multiple allergies    Headache    sinus and migraines   Osteoarthritis    PONV (postoperative nausea and vomiting)     Past Surgical History:  Procedure Laterality Date   FRACTURE SURGERY Right 2009   ORIF arm   INTRAUTERINE DEVICE INSERTION       Current Outpatient Medications:    diclofenac Sodium (VOLTAREN) 1 % GEL, Apply 2 g topically 4 (four) times daily. Rub into affected area of foot 2 to 4 times daily, Disp: 100 g, Rfl: 2   alendronate (FOSAMAX) 70 MG tablet, alendronate 70 mg tablet, Disp: , Rfl:    ALPRAZolam (XANAX) 0.5 MG tablet, Take 0.5 mg by mouth as needed for anxiety (only when she flies.). , Disp: , Rfl:    Ascorbic Acid (VITAMIN C) 1000 MG tablet, Take 1,000 mg by mouth daily., Disp: , Rfl:    Multiple Minerals-Vitamins (NUTRA-SUPPORT BONE  PO), Take 3 tablets by mouth daily., Disp: , Rfl:    Omega-3 Fatty Acids (OMEGA-3 FISH OIL PO), Take 2 tablets by mouth daily., Disp: , Rfl:    Probiotic Product (PROBIOTIC ADVANCED PO), Take 1 tablet by mouth daily., Disp: , Rfl:    Zinc Acetate, Oral, (ZINC ACETATE PO), Take 1 tablet by mouth daily., Disp: , Rfl:   No Known Allergies       Objective:  Physical Exam  General: AAO x3, NAD  Dermatological: Superficial wound present the posterior aspect left heel which is starting to scab over.  No drainage or pus or surrounding redness or red streaks.  Mild edema.   Vascular: Dorsalis Pedis artery and Posterior Tibial artery pedal pulses are 2/4 bilateral with immedate capillary fill time.  There is no pain with calf compression, swelling, warmth, erythema.   Neruologic: Grossly intact via light touch bilateral.  Negative Tinel sign.  Musculoskeletal: Tenderness to palpation along the plantar medial tubercle of the calcaneus at the insertion of plantar fascia on the left and right foot. There is no pain along the course of the plantar fascia within the arch of the foot. Plantar fascia appears to be  intact. There is no pain with lateral compression of the calcaneus or pain with vibratory sensation. There is no pain along the course or insertion of the achilles tendon. No other areas of tenderness to bilateral lower extremities.there is no pain to the ankles or along the flexor, extensor tendons.  Muscular strength 5/5 in all groups tested bilateral.  Gait: Unassisted, Nonantalgic.       Assessment:   Follow-up chronic heel pain, Plantar fasciitis     Plan:  -Treatment options discussed including all alternatives, risks, and complications -Etiology of symptoms were discussed -X-rays were obtained and reviewed with the patient.  Mild calcaneal spurring present.  No evidence of acute fracture. -We discussed different treatment options.  Discussed from inflammatory standpoint topical  versus oral anti-inflammatories and she wants to do topical.  Prescribe Voltaren gel.  Since icing daily.  Discussed stretching as well as wearing shoes and good arch supports.  Recommended power steps.  Night splint was dispensed.  We discussed alternative treatments including physical therapy, EPAT, custom inserts.  If no improvement from what she is doing to let me know we can consider other options. -Regards to the wound continue treatment by orthopedics.  Vivi Barrack DPM

## 2022-05-13 ENCOUNTER — Other Ambulatory Visit: Payer: Self-pay | Admitting: Obstetrics and Gynecology

## 2022-05-13 DIAGNOSIS — Z1231 Encounter for screening mammogram for malignant neoplasm of breast: Secondary | ICD-10-CM

## 2022-05-22 ENCOUNTER — Ambulatory Visit
Admission: RE | Admit: 2022-05-22 | Discharge: 2022-05-22 | Disposition: A | Discharge status: Home / Self Care | Payer: BC Managed Care – PPO | Source: Ambulatory Visit | Attending: Obstetrics and Gynecology | Admitting: Obstetrics and Gynecology

## 2022-05-22 DIAGNOSIS — Z1231 Encounter for screening mammogram for malignant neoplasm of breast: Secondary | ICD-10-CM

## 2022-08-20 IMAGING — MG MM DIGITAL SCREENING BILAT W/ TOMO AND CAD
8 series · 9 of 24 positions shown · non-contrast
Comparison: Previous exam(s).

CLINICAL DATA: Screening.

EXAM:
DIGITAL SCREENING BILATERAL MAMMOGRAM WITH TOMOSYNTHESIS AND CAD
TECHNIQUE: Bilateral screening digital craniocaudal and mediolateral oblique
mammograms were obtained. Bilateral screening digital breast
tomosynthesis was performed. The images were evaluated with
computer-aided detection.

[R CC synth-2D]
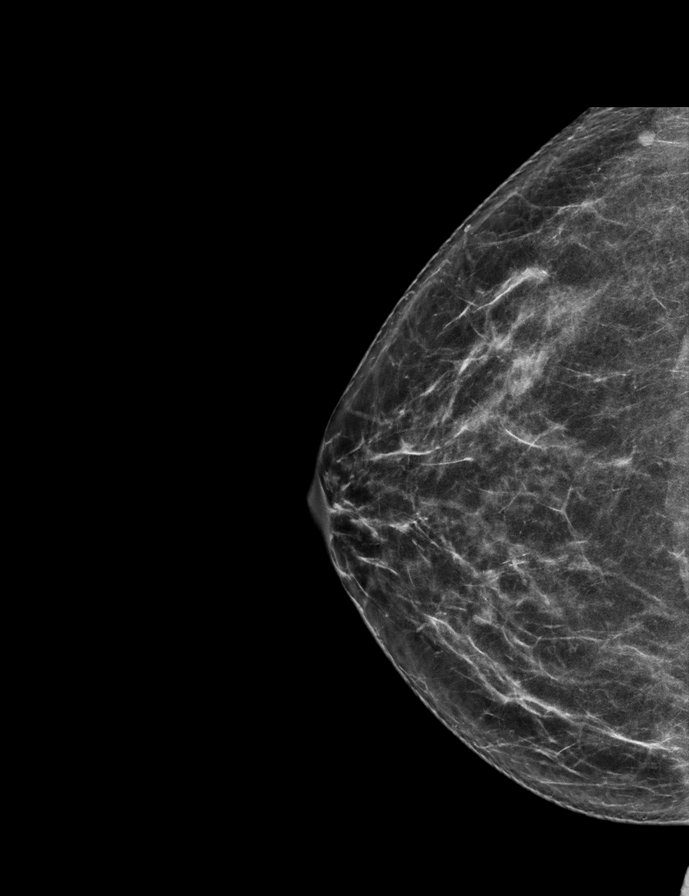

[R MLO synth-2D]
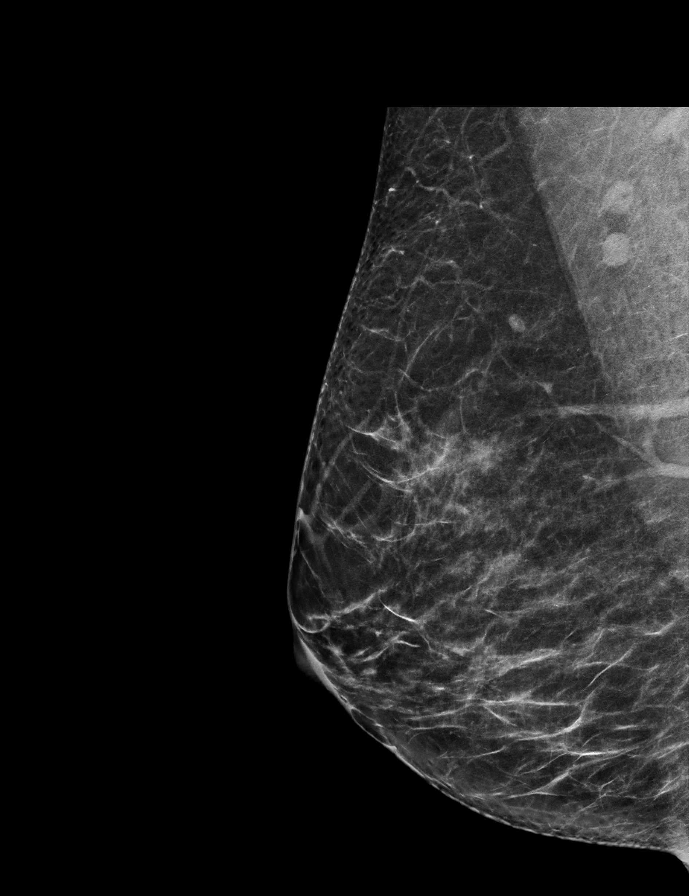

[L CC synth-2D]
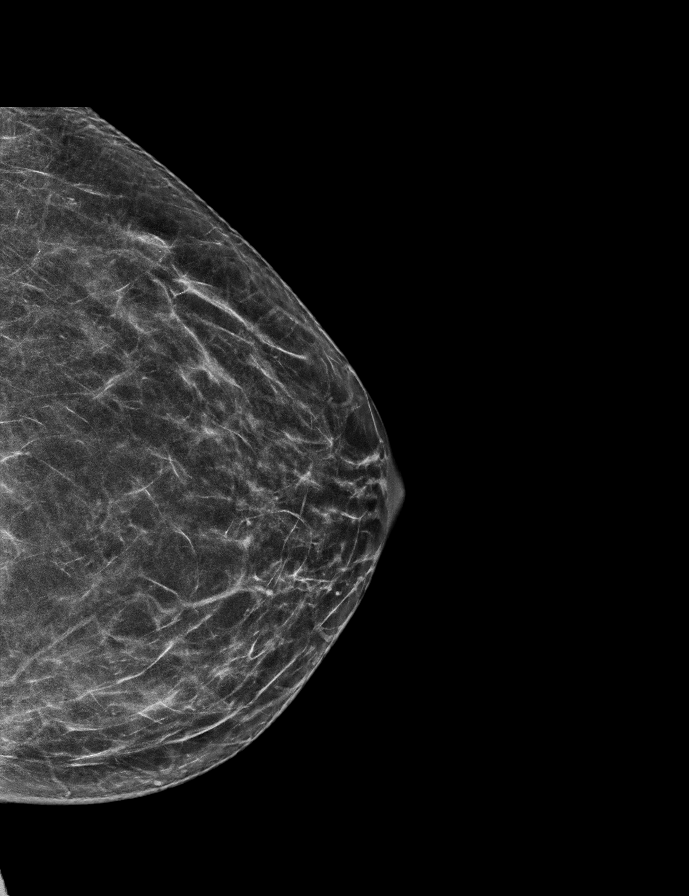

[L MLO synth-2D]
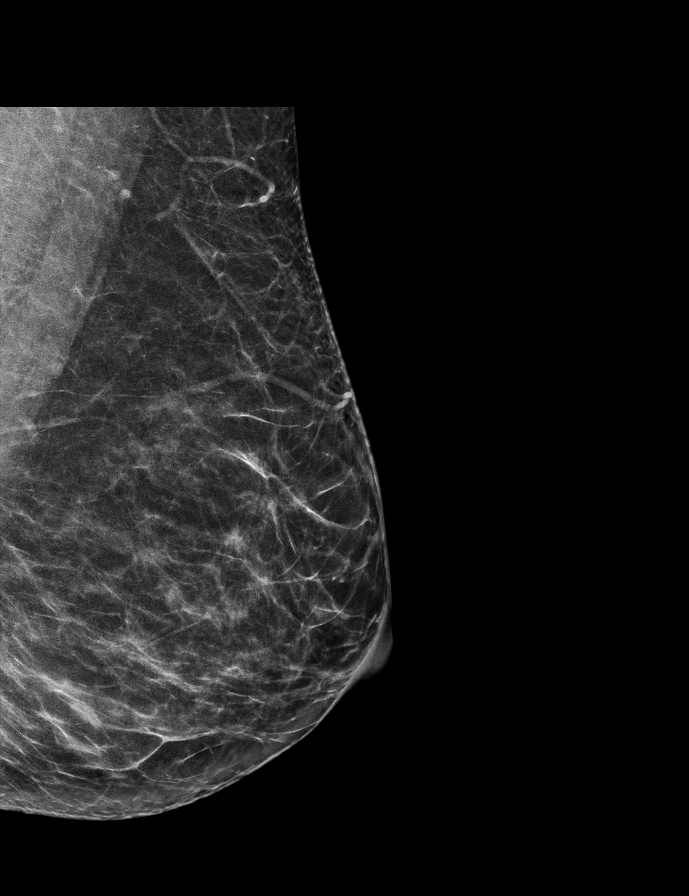

[L CC tomo · 2 of 55 frames shown]
[frame 18/55]
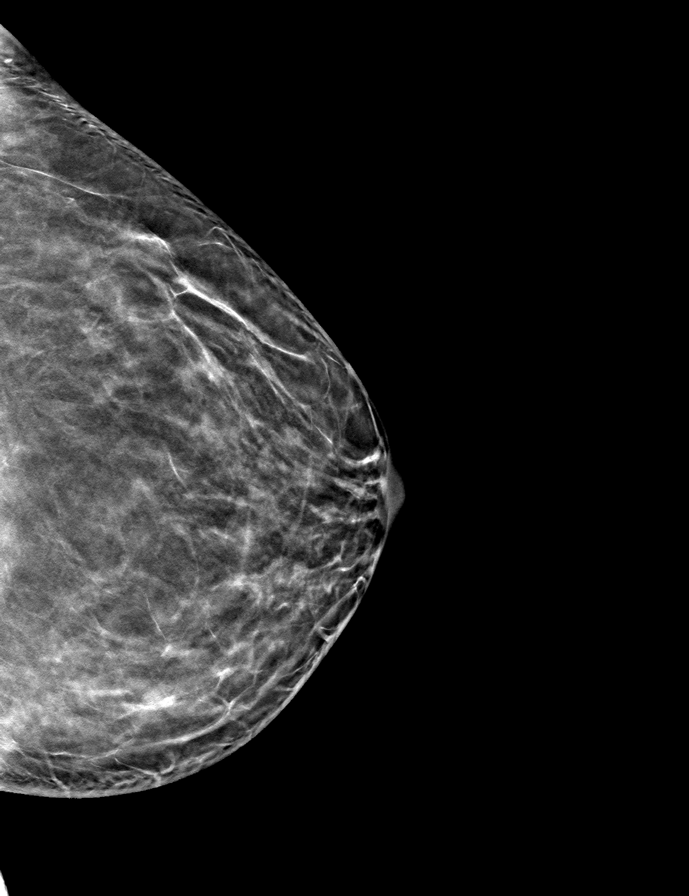
[frame 28/55]
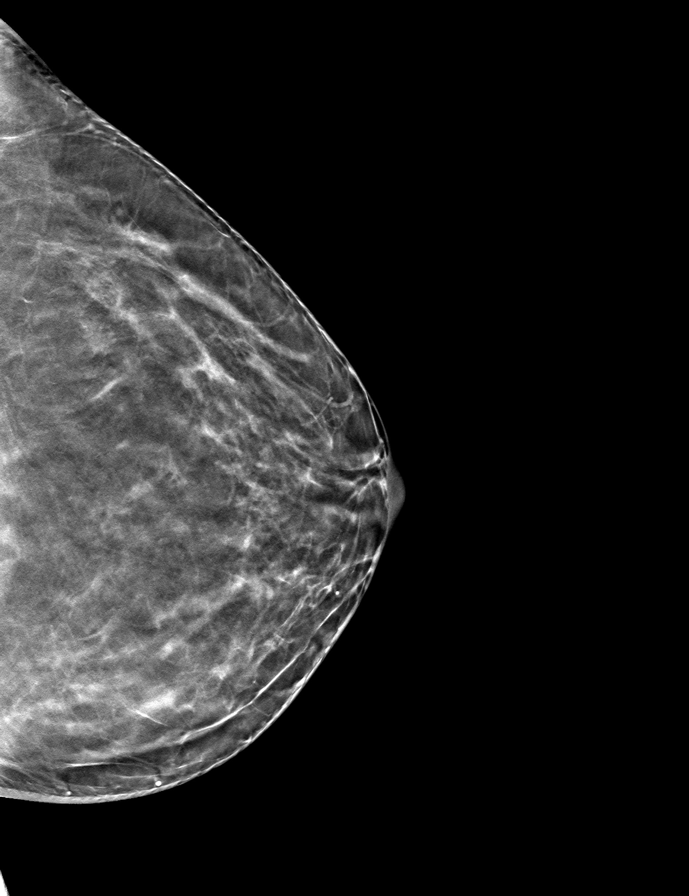

[L MLO tomo · tomo slice 28/55.0]
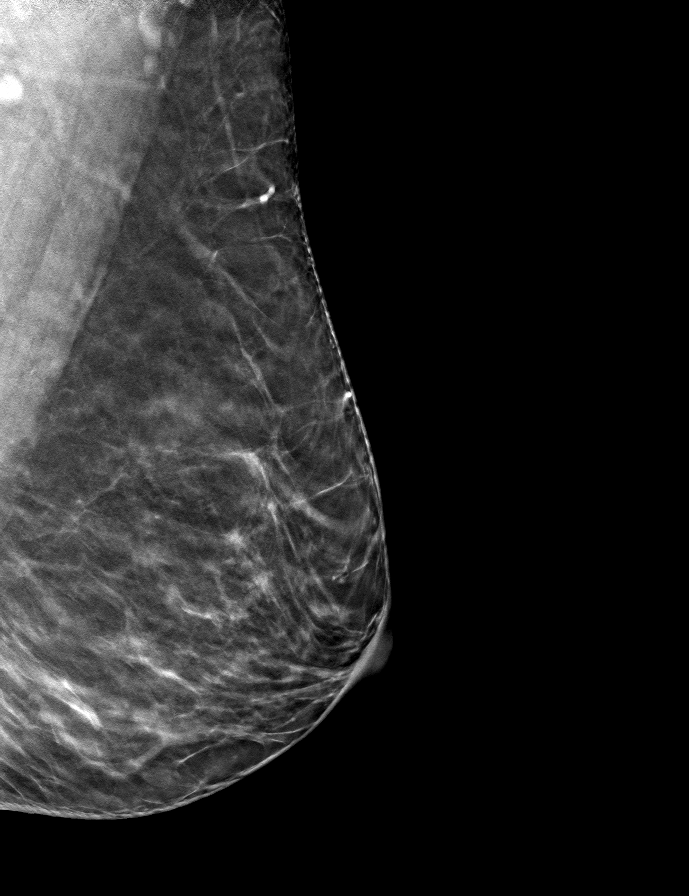

[R CC tomo · tomo slice 29/58.0]
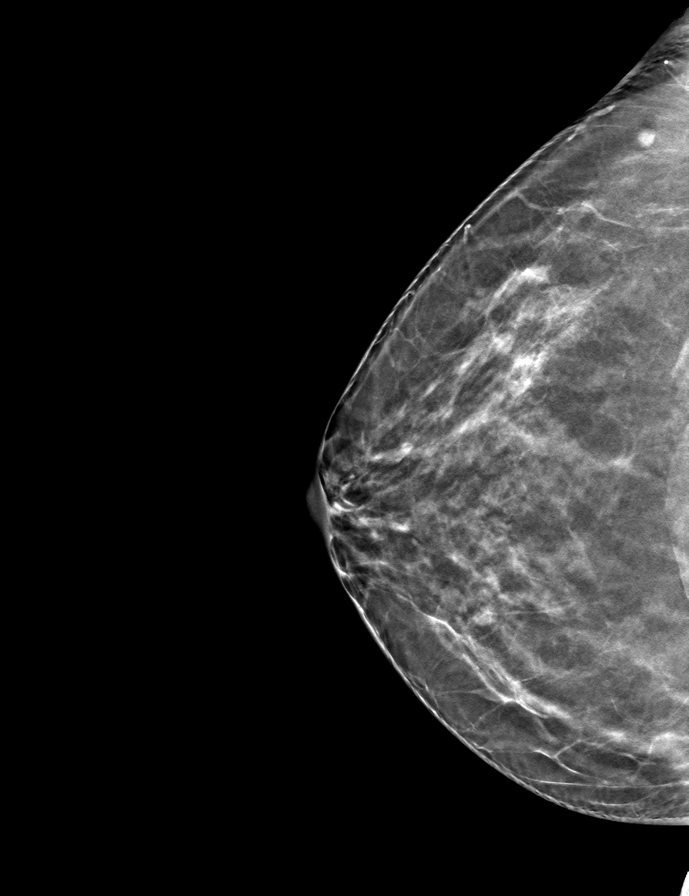

[R MLO tomo · tomo slice 29/58.0]
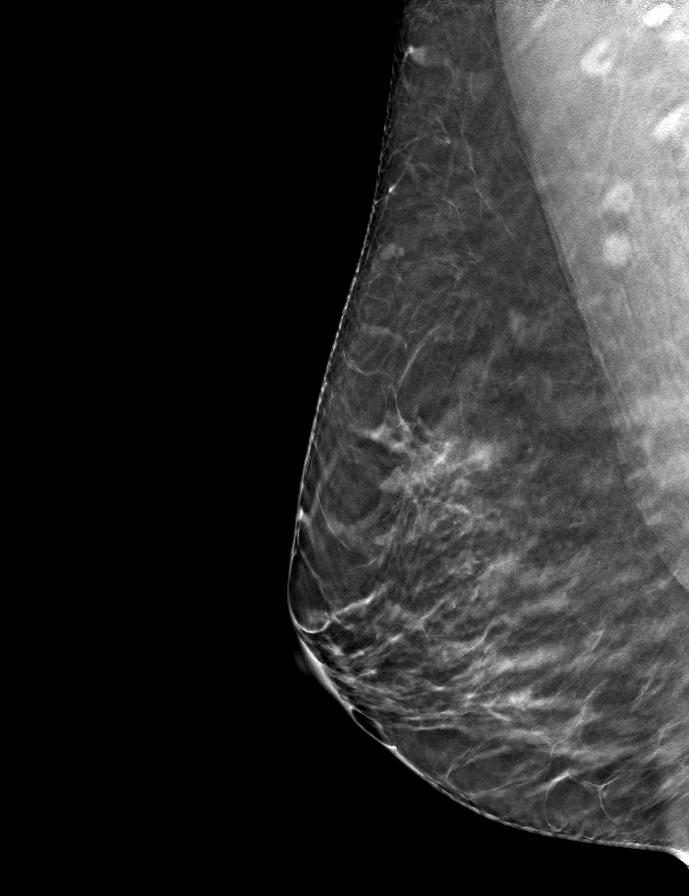

[9 of 24 positions shown; findings below may reference images not displayed]

ACR Breast Density Category b: There are scattered areas of
fibroglandular density.
FINDINGS: There are no findings suspicious for malignancy.
IMPRESSION: No mammographic evidence of malignancy. A result letter of this
screening mammogram will be mailed directly to the patient.

RECOMMENDATION:
Screening mammogram in one year. (Code:51-O-LD2)

BI-RADS CATEGORY  1: Negative.

## 2022-11-19 ENCOUNTER — Other Ambulatory Visit: Payer: BC Managed Care – PPO

## 2022-11-19 ENCOUNTER — Ambulatory Visit: Payer: BC Managed Care – PPO

## 2022-11-19 VITALS — BP 106/68 | HR 69 | Resp 16 | Ht 67.0 in | Wt 164.0 lb

## 2022-11-19 DIAGNOSIS — R002 Palpitations: Secondary | ICD-10-CM

## 2022-11-19 DIAGNOSIS — R0789 Other chest pain: Secondary | ICD-10-CM

## 2022-11-19 NOTE — Progress Notes (Signed)
Primary Physician/Referring:  Sueanne Margarita, DO  Patient ID: Jenna Morgan Doctor, female    DOB: 07/31/1961, 61 y.o.   MRN: [id number]  Chief Complaint  Patient presents with   Palpitations   Coronary Artery Disease   HPI:    Kristol Almanzar  is a 61 y.o. Caucasian female with a past medical history of anemia, left bundle branch block, GERD, and osteoporosis.  Family history significant for myocardial infarction infarction in mother and father.  She presents today to establish care with cardiology for evaluation of palpitations.  She was recently seen by her primary care on 11/07/2022 at which time she had complaints of palpitations that lasted 1-1/2 hours accompanied by shortness of breath.  Palpitations occur several times per week and are not intermittent and feel like a slow heart rate or skipped beats. She has had these episodes since her 41s.  She was previously on a beta-blocker approximately 15 years ago but stopped taking this because it caused fatigue and low stamina.  She also endorses intermittent burning sensation in chest that is relieved on its own.  She does have a history of acid reflux and feels that these episodes are due to acid reflux.  She is unsure if the burning sensation occurs after eating or with exertion. She denies shortness of breath, leg edema, orthopnea, PND, syncope.   Past Medical History:  Diagnosis Date   Anemia    Bronchitis    Cold    getting over a cold   Complication of anesthesia    Dysrhythmia    LBBB   Dysrhythmia, cardiac    heart races at times, tachycardia   GERD (gastroesophageal reflux disease)    at times   H/O multiple allergies    Headache    sinus and migraines   Osteoarthritis    PONV (postoperative nausea and vomiting)    Past Surgical History:  Procedure Laterality Date   FRACTURE SURGERY Right 2009   ORIF arm   INTRAUTERINE DEVICE INSERTION     Family History  Problem Relation Age of Onset   Migraines Mother    COPD  Mother    Heart attack Father    Hypertension Father    Ulcers Father    Alcoholism Father    Asthma Sister    Aneurysm Paternal Aunt    Alzheimer's disease Maternal Grandmother    Cervical cancer Maternal Grandmother    Aneurysm Paternal Grandmother    Heart disease Paternal Grandfather    Breast cancer Cousin        36s   Breast cancer Cousin        73s    Social History   Tobacco Use   Smoking status: Never   Smokeless tobacco: Never  Substance Use Topics   Alcohol use: Yes    Comment: ocassionally   Marital Status: Single  ROS  Review of Systems  Cardiovascular:  Positive for irregular heartbeat and palpitations. Negative for chest pain, claudication, dyspnea on exertion, leg swelling, near-syncope, orthopnea and syncope.  Respiratory:  Negative for snoring.   Neurological:  Negative for dizziness and light-headedness.   Objective  Blood pressure 106/68, pulse 69, resp. rate 16, height _0  (1.702 m), weight 164 lb (74.4 kg), last menstrual period 10/13/2014, SpO2 97 %. Body mass index is 25.69 kg/m.     11/19/2022   10:36 AM 03/16/2020    8:21 AM 10/23/2014    8:21 AM  Vitals with BMI  Height _1  5' 7.5" 5'  7.5"  Weight 164 lbs 177 lbs 167 lbs 5 oz  BMI 25.68 26.3 33.5  Systolic 456 256 389  Diastolic 68 71 68  Pulse 69 82 82    Physical Exam Neck:     Vascular: No carotid bruit or JVD.  Cardiovascular:     Rate and Rhythm: Normal rate and regular rhythm.     Pulses: Normal pulses.          Carotid pulses are 2+ on the right side and 2+ on the left side.      Radial pulses are 2+ on the right side and 2+ on the left side.       Dorsalis pedis pulses are 2+ on the right side and 2+ on the left side.       Posterior tibial pulses are 2+ on the right side and 2+ on the left side.     Heart sounds: Normal heart sounds. No murmur heard.    No gallop.  Pulmonary:     Effort: Pulmonary effort is normal. No respiratory distress.     Breath sounds: Normal  breath sounds. No wheezing.  Musculoskeletal:     Right lower leg: No edema.     Left lower leg: No edema.  Neurological:     Mental Status: She is alert.    Medications and allergies  No Known Allergies   Medication list after today's encounter   Current Outpatient Medications:    ALPRAZolam (XANAX) 0.5 MG tablet, Take 0.5 mg by mouth as needed for anxiety (only when she flies.). , Disp: , Rfl:    Ascorbic Acid (VITAMIN C) 1000 MG tablet, Take 1,000 mg by mouth daily., Disp: , Rfl:    Multiple Minerals-Vitamins (NUTRA-SUPPORT BONE PO), Take 3 tablets by mouth daily., Disp: , Rfl:    Omega-3 Fatty Acids (OMEGA-3 FISH OIL PO), Take 2 tablets by mouth daily., Disp: , Rfl:    Probiotic Product (PROBIOTIC ADVANCED PO), Take 1 tablet by mouth daily., Disp: , Rfl:    Zinc Acetate, Oral, (ZINC ACETATE PO), Take 1 tablet by mouth daily., Disp: , Rfl:   Laboratory examination:   Lab Results  Component Value Date   NA 142 10/23/2014   K 4.2 10/23/2014   CO2 26 10/23/2014   GLUCOSE 53 (L) 10/23/2014   BUN 19 10/23/2014   CREATININE 0.83 10/23/2014   CALCIUM 9.6 10/23/2014   GFRNONAA 81 (L) 10/23/2014       Latest Ref Rng & Units 10/23/2014    9:00 AM 12/02/2010   12:10 PM  CMP  Glucose 70 - 99 mg/dL 53  105   BUN 6 - 23 mg/dL 19  16   Creatinine 0.50 - 1.10 mg/dL 0.83  0.9   Sodium 137 - 147 mEq/L 142  139   Potassium 3.7 - 5.3 mEq/L 4.2  3.8   Chloride 96 - 112 mEq/L 105  105   CO2 19 - 32 mEq/L 26    Calcium 8.4 - 10.5 mg/dL 9.6        Latest Ref Rng & Units 10/23/2014    9:00 AM 12/02/2010   12:10 PM 12/02/2010   11:58 AM  CBC  WBC 4.0 - 10.5 K/uL 4.4   5.2   Hemoglobin 12.0 - 15.0 g/dL 13.3  13.9  13.4   Hematocrit 36.0 - 46.0 % 39.9  41.0  39.7   Platelets 150 - 400 K/uL 288   288     External labs:  11/07/2022: Sodium 136, potassium 3.8, glucose 84, creatinine 1.0, BUN 12, EGFR 56.9 Hemoglobin 12.4, hematocrit 36, MCV 96.6, platelets 270 TSH 1.46, free T4  1.4  Radiology:    Cardiac Studies:   NM Myocardial Lexiscan 12/03/2010: Narrative This Lexiscan study was performed by cardiology staff.  EKG showed LBBB. The quality of the study is good. Perfusion images at rest and with stress show no perfusion abnormalities. The end-diastolic volume is 93. The end-systolic volume was 38. The left ventricular ejection fraction is 59%.  There were no wall motion abnormalities.   Impression:   Normal Lexiscan Study.  EKG:   EKG 11/19/2022: Normal sinus rhythm at rate of 68 bpm.  Normal axis.  Left bundle branch block.  Poor R wave progression, cannot exclude old anterior infarct.  No prior EKG for comparison.  Assessment     ICD-10-CM   1. Palpitations  R00.2 EKG 12-Lead    LONG TERM MONITOR (3-14 DAYS)    2. Burning in the chest  R07.89        Orders Placed This Encounter  Procedures   LONG TERM MONITOR (3-14 DAYS)    Standing Status:   Future    Standing Expiration Date:   11/20/2023    Order Specific Question:   Where should this test be performed?    Answer:   [facility]    Order Specific Question:   Does the patient have an implanted cardiac device?    Answer:   No    Order Specific Question:   Prescribed days of wear    Answer:   87    Order Specific Question:   Type of enrollment    Answer:   Clinic Enrollment    Order Specific Question:   Release to patient    Answer:   Immediate   EKG 12-Lead    No orders of the defined types were placed in this encounter.   Medications Discontinued During This Encounter  Medication Reason   alendronate (FOSAMAX) 70 MG tablet    Ascorbic Acid (VITAMIN C) 1000 MG tablet    diclofenac Sodium (VOLTAREN) 1 % GEL      Recommendations:   Hazel Leveille is a 61 y.o.  Caucasian female with a past medical history of anemia, left bundle branch block, GERD, and osteoporosis.  Family history significant for myocardial infarction infarction in mother and  father.  Palpitations Given frequency and longevity of palpitations will place cardiac event monitor for 2 weeks to evaluate for ectopic burden or other arrhythmias. EKG revealed normal sinus rhythm with left bundle branch block that is not new.  Palpitations, symptoms suggest PAC/PVC: Will emperically try Vit B1 (Thiamine) 50 mg, Vit B6 (Pyrodoxine) 50 mg and Vit B12 (rappid release) 1015mg, twice daily for palpitations and tachycardia.  She is currently taking fish oil twice daily.  Burning in the chest Feel that symptoms most closely correlate with acid reflux.  However, I have asked her to closely monitor the symptoms and note when they are occurring. If she continues to have burning sensation in chest that occur with exertion and are not relieved with rest we will schedule for echocardiogram and nuclear exercise stress test to evaluate for cardiac etiology. Blood pressure is under excellent control. Reviewed and interpreted external labs, no significant abnormalities including TSH. We did discuss over-the-counter acid reflux relief medications.  Follow-up in 4 weeks or sooner if needed.   BErnst Spell AGNP-C  11/19/2022, 11:14 AM Office: [phone]: [phone]

## 2022-12-17 ENCOUNTER — Ambulatory Visit: Payer: BC Managed Care – PPO | Admitting: Internal Medicine

## 2022-12-17 ENCOUNTER — Ambulatory Visit: Payer: BC Managed Care – PPO

## 2022-12-17 ENCOUNTER — Encounter: Payer: Self-pay | Admitting: Internal Medicine

## 2022-12-17 VITALS — BP 135/64 | HR 69 | Ht 67.0 in | Wt 166.2 lb

## 2022-12-17 DIAGNOSIS — R0789 Other chest pain: Secondary | ICD-10-CM

## 2022-12-17 DIAGNOSIS — R002 Palpitations: Secondary | ICD-10-CM | POA: Insufficient documentation

## 2022-12-17 NOTE — Progress Notes (Signed)
Primary Physician/Referring:  Sueanne Margarita, DO  Patient ID: Jenna Morgan, female    DOB: [birth year], 61 y.o.   MRN: [id number]  Chief Complaint  Patient presents with   Palpitations   Follow-up   Results   HPI:    Jenna Morgan  is a 61 y.o. Caucasian female with a past medical history of anemia, left bundle branch block, GERD, and osteoporosis.  Family history significant for myocardial infarction infarction in mother and father.  Patient is here for a follow-up visit. She wore an event monitor for 2 weeks and it did not show any high degree heart block or atrial fibrillation. She still has palpitations but she does not want to take any medication since her event monitor results were normal. Patient feels much better knowing this. She denies shortness of breath, leg edema, orthopnea, PND, syncope.   Past Medical History:  Diagnosis Date   Anemia    Bronchitis    Cold    getting over a cold   Complication of anesthesia    Dysrhythmia    LBBB   Dysrhythmia, cardiac    heart races at times, tachycardia   GERD (gastroesophageal reflux disease)    at times   H/O multiple allergies    Headache    sinus and migraines   Osteoarthritis    PONV (postoperative nausea and vomiting)    Past Surgical History:  Procedure Laterality Date   FRACTURE SURGERY Right 2009   ORIF arm   INTRAUTERINE DEVICE INSERTION     Family History  Problem Relation Age of Onset   Migraines Mother    COPD Mother    Heart attack Father    Hypertension Father    Ulcers Father    Alcoholism Father    Asthma Sister    Aneurysm Paternal Aunt    Alzheimer's disease Maternal Grandmother    Cervical cancer Maternal Grandmother    Aneurysm Paternal Grandmother    Heart disease Paternal Grandfather    Breast cancer Cousin        28s   Breast cancer Cousin        65s    Social History   Tobacco Use   Smoking status: Never   Smokeless tobacco: Never  Substance Use Topics   Alcohol use: Yes     Comment: ocassionally   Marital Status: Single  ROS  Review of Systems  Cardiovascular:  Positive for palpitations. Negative for chest pain, claudication, dyspnea on exertion, leg swelling, near-syncope, orthopnea and syncope.  Respiratory:  Negative for snoring.   Neurological:  Negative for dizziness and light-headedness.   Objective  Blood pressure 135/64, pulse 69, height _0  (1.702 m), weight 166 lb 3.2 oz (75.4 kg), last menstrual period 10/13/2014, SpO2 96 %. Body mass index is 26.03 kg/m.     12/17/2022    8:42 AM 11/19/2022   10:36 AM 03/16/2020    8:21 AM  Vitals with BMI  Height _1  _2  5' 7.5"  Weight 166 lbs 3 oz 164 lbs 177 lbs  BMI 26.02 37.90 24.0  Systolic 973 532 992  Diastolic 64 68 71  Pulse 69 69 82    Physical Exam Neck:     Vascular: No carotid bruit or JVD.  Cardiovascular:     Rate and Rhythm: Normal rate and regular rhythm.     Pulses: Normal pulses.          Carotid pulses are 2+ on the right side and 2+ on  the left side.      Radial pulses are 2+ on the right side and 2+ on the left side.       Dorsalis pedis pulses are 2+ on the right side and 2+ on the left side.       Posterior tibial pulses are 2+ on the right side and 2+ on the left side.     Heart sounds: Normal heart sounds. No murmur heard.    No gallop.  Pulmonary:     Effort: Pulmonary effort is normal. No respiratory distress.     Breath sounds: Normal breath sounds. No wheezing.  Musculoskeletal:     Right lower leg: No edema.     Left lower leg: No edema.  Neurological:     Mental Status: She is alert.    Medications and allergies  No Known Allergies   Medication list after today's encounter   Current Outpatient Medications:    ALPRAZolam (XANAX) 0.5 MG tablet, Take 0.5 mg by mouth as needed for anxiety (only when she flies.). , Disp: , Rfl:    Ascorbic Acid (VITAMIN C) 1000 MG tablet, Take 1,000 mg by mouth daily., Disp: , Rfl:    Multiple Minerals-Vitamins  (NUTRA-SUPPORT BONE PO), Take 3 tablets by mouth daily., Disp: , Rfl:    Omega-3 Fatty Acids (OMEGA-3 FISH OIL PO), Take 2 tablets by mouth daily., Disp: , Rfl:    Probiotic Product (PROBIOTIC ADVANCED PO), Take 1 tablet by mouth daily., Disp: , Rfl:    Zinc Acetate, Oral, (ZINC ACETATE PO), Take 1 tablet by mouth daily., Disp: , Rfl:   Laboratory examination:   Lab Results  Component Value Date   NA 142 10/23/2014   K 4.2 10/23/2014   CO2 26 10/23/2014   GLUCOSE 53 (L) 10/23/2014   BUN 19 10/23/2014   CREATININE 0.83 10/23/2014   CALCIUM 9.6 10/23/2014   GFRNONAA 81 (L) 10/23/2014       Latest Ref Rng & Units 10/23/2014    9:00 AM 12/02/2010   12:10 PM  CMP  Glucose 70 - 99 mg/dL 53  105   BUN 6 - 23 mg/dL 19  16   Creatinine 0.50 - 1.10 mg/dL 0.83  0.9   Sodium 137 - 147 mEq/L 142  139   Potassium 3.7 - 5.3 mEq/L 4.2  3.8   Chloride 96 - 112 mEq/L 105  105   CO2 19 - 32 mEq/L 26    Calcium 8.4 - 10.5 mg/dL 9.6        Latest Ref Rng & Units 10/23/2014    9:00 AM 12/02/2010   12:10 PM 12/02/2010   11:58 AM  CBC  WBC 4.0 - 10.5 K/uL 4.4   5.2   Hemoglobin 12.0 - 15.0 g/dL 13.3  13.9  13.4   Hematocrit 36.0 - 46.0 % 39.9  41.0  39.7   Platelets 150 - 400 K/uL 288   288     External labs:   11/07/2022: Sodium 136, potassium 3.8, glucose 84, creatinine 1.0, BUN 12, EGFR 56.9 Hemoglobin 12.4, hematocrit 36, MCV 96.6, platelets 270 TSH 1.46, free T4 1.4  Radiology:    Cardiac Studies:   NM Myocardial Lexiscan 12/03/2010: Narrative This Lexiscan study was performed by cardiology staff.  EKG showed LBBB. The quality of the study is good. Perfusion images at rest and with stress show no perfusion abnormalities. The end-diastolic volume is 93. The end-systolic volume was 38. The left ventricular ejection fraction is 59%.  There were no wall motion abnormalities.   Impression:   Normal Lexiscan Study.    Z[facility] Extended out patient EKG monitoring 14 days  starting 11/19/2022: Predominant Rhythm :        Normal sinus rhythm min HR: 45 at 4:40 AM. Max HR 165 at 7:46 PM Atrial arrhythmias:               15 brief atrial tachycardia episodes 13 beats.  Occasional PACs. Atrial fibrillation:                  None Ventricular arrhythmias:      4 NSVT episodes, longest 6 beats.  All occurring at middle of the night.  Occasional brief bigeminy and trigeminy.  PVC                                    Burden <1% Heart Block:                        None Symptoms:                          Symptoms correlated with brief atrial tachycardia episodes and PACs and PVCs    EKG:   EKG 11/19/2022: Normal sinus rhythm at rate of 68 bpm.  Normal axis.  Left bundle branch block.  Poor R wave progression, cannot exclude old anterior infarct.  No prior EKG for comparison.  Assessment     ICD-10-CM   1. Palpitations  R00.2     2. Burning in the chest  R07.89        No orders of the defined types were placed in this encounter.   No orders of the defined types were placed in this encounter.   There are no discontinued medications.    Recommendations:   Jenna Morgan is a 61 y.o.  Caucasian female with a past medical history of anemia, left bundle branch block, GERD, and osteoporosis.  Family history significant for myocardial infarction infarction in mother and father.  Palpitations Event monitor did not show any high degree heart block or atrial fibrillation Patient does not want to take any additional medications since her monitor was normal Recommend Vit B1 (Thiamine) 50 mg, Vit B6 (Pyrodoxine) 50 mg and Vit B12 (rappid release) 1060mg, twice daily for palpitations and tachycardia.  She is currently taking fish oil twice daily.   Burning in the chest Feel that symptoms most closely correlate with acid reflux.   Recommend over-the-counter acid reflux relief medications or refer to GI.  Follow-up on as needed basis     SFloydene Flock DO,  [facility] 12/17/2022, 9:30 AM Office: [phone]: [phone]

## 2023-03-16 ENCOUNTER — Encounter: Payer: Self-pay | Admitting: Internal Medicine

## 2023-03-16 ENCOUNTER — Other Ambulatory Visit: Payer: Self-pay | Admitting: Internal Medicine

## 2023-03-16 DIAGNOSIS — R140 Abdominal distension (gaseous): Secondary | ICD-10-CM

## 2023-03-16 DIAGNOSIS — K2900 Acute gastritis without bleeding: Secondary | ICD-10-CM

## 2023-04-14 ENCOUNTER — Ambulatory Visit
Admission: RE | Admit: 2023-04-14 | Discharge: 2023-04-14 | Disposition: A | Discharge status: Home / Self Care | Payer: BC Managed Care – PPO | Source: Ambulatory Visit | Attending: Internal Medicine | Admitting: Internal Medicine

## 2023-04-14 DIAGNOSIS — K2900 Acute gastritis without bleeding: Secondary | ICD-10-CM

## 2023-04-14 DIAGNOSIS — R140 Abdominal distension (gaseous): Secondary | ICD-10-CM

## 2023-04-21 ENCOUNTER — Other Ambulatory Visit: Payer: Self-pay | Admitting: Family Medicine

## 2023-04-21 DIAGNOSIS — R140 Abdominal distension (gaseous): Secondary | ICD-10-CM

## 2023-04-21 DIAGNOSIS — R1011 Right upper quadrant pain: Secondary | ICD-10-CM

## 2023-05-11 ENCOUNTER — Other Ambulatory Visit: Payer: Self-pay | Admitting: Obstetrics and Gynecology

## 2023-05-11 ENCOUNTER — Ambulatory Visit
Admission: RE | Admit: 2023-05-11 | Discharge: 2023-05-11 | Disposition: A | Discharge status: Home / Self Care | Payer: BC Managed Care – PPO | Source: Ambulatory Visit | Attending: Family Medicine | Admitting: Family Medicine

## 2023-05-11 DIAGNOSIS — R140 Abdominal distension (gaseous): Secondary | ICD-10-CM

## 2023-05-11 DIAGNOSIS — R1011 Right upper quadrant pain: Secondary | ICD-10-CM

## 2023-05-11 DIAGNOSIS — Z1231 Encounter for screening mammogram for malignant neoplasm of breast: Secondary | ICD-10-CM

## 2023-05-11 MED ORDER — IOPAMIDOL (ISOVUE-300) INJECTION 61%
100.0000 mL | Freq: Once | INTRAVENOUS | Status: AC | PRN
  Administered 2023-05-11: 100 mL via INTRAVENOUS

## 2023-05-27 ENCOUNTER — Ambulatory Visit
Admission: RE | Admit: 2023-05-27 | Discharge: 2023-05-27 | Disposition: A | Discharge status: Home / Self Care | Payer: BC Managed Care – PPO | Source: Ambulatory Visit | Attending: Obstetrics and Gynecology | Admitting: Obstetrics and Gynecology

## 2023-05-27 DIAGNOSIS — Z1231 Encounter for screening mammogram for malignant neoplasm of breast: Secondary | ICD-10-CM

## 2024-05-16 ENCOUNTER — Other Ambulatory Visit: Payer: Self-pay | Admitting: Obstetrics and Gynecology

## 2024-05-16 DIAGNOSIS — Z1231 Encounter for screening mammogram for malignant neoplasm of breast: Secondary | ICD-10-CM

## 2024-05-27 ENCOUNTER — Ambulatory Visit
Admission: RE | Admit: 2024-05-27 | Discharge: 2024-05-27 | Disposition: A | Discharge status: Home / Self Care | Payer: Self-pay | Source: Ambulatory Visit | Attending: Obstetrics and Gynecology | Admitting: Obstetrics and Gynecology

## 2024-05-27 DIAGNOSIS — Z1231 Encounter for screening mammogram for malignant neoplasm of breast: Secondary | ICD-10-CM

## 2024-06-01 ENCOUNTER — Other Ambulatory Visit: Payer: Self-pay | Admitting: Obstetrics and Gynecology

## 2024-06-01 DIAGNOSIS — R928 Other abnormal and inconclusive findings on diagnostic imaging of breast: Secondary | ICD-10-CM

## 2024-06-09 ENCOUNTER — Ambulatory Visit
Admission: RE | Admit: 2024-06-09 | Discharge: 2024-06-09 | Disposition: A | Discharge status: Home / Self Care | Source: Ambulatory Visit | Attending: Obstetrics and Gynecology | Admitting: Obstetrics and Gynecology

## 2024-06-09 DIAGNOSIS — R928 Other abnormal and inconclusive findings on diagnostic imaging of breast: Secondary | ICD-10-CM

## 2024-07-13 ENCOUNTER — Encounter: Payer: Self-pay | Admitting: Neurology

## 2024-08-22 NOTE — Progress Notes (Signed)
 Initial neurology clinic note  Reason for Evaluation: Consultation requested by Valentin Skates, DO for an opinion regarding muscle twitching. My final recommendations will be communicated back to the requesting physician by way of shared medical record or letter to requesting physician via [place]  mail.  HPI: This is Ms. Jenna Morgan, a 63 y.o. right-handed female with a medical history of LBB, OA and osteoporosis who presents to neurology clinic with the chief complaint of muscle twitching. The patient is alone today. She is an [employer], broadcasting/film/video.  Patient first noticed tremors in her face and was seen by [facility] back then. Patient was also seen by movement neurology at [facility] in 2021. They thought it could be ET. There was never any treatment. This still occurs when she fake smiles.  In 04/2024, patient started getting muscle twitches throughout her body (tongue, arms, legs). After 2 weeks, she saw her PCP. She had blood work and her B12 was > 2000. She stopped taking B12 and this cut her twitches by half. She stopped all of her supplements for 3 weeks and noticed no difference, so she restarted them.   She has occasional aching in the left foot. She also has intermittent tingling/numbness of her legs, which she thinks has improved of late. She denies tingling in her arms or hands. She gets occasional jerks coming in or out of sleep as well.  Patient is most concerned about ALS.  The patient denies symptoms suggestive of oculobulbar weakness including diplopia, ptosis, dysphagia, poor saliva control, dysarthria/dysphonia, impaired mastication, facial weakness/droop.  There are no neuromuscular respiratory weakness symptoms, particularly orthopnea>dyspnea.   She does not report any constitutional symptoms like fever, night sweats, anorexia or unintentional weight loss.  EtOH use: socially, couple of times per month  Restrictive diet? No Family history of neuropathy/myopathy/NM disease? She has  a strong family history of aneurysm, Alzheimer's dementia  Patient takes a lot of supplements: omega 3 fish oil, ginger, tumeric, vit D, zinc (during the winter months), calcium, brain save, and lion's mane.   MEDICATIONS:  Outpatient Encounter Medications as of 08/31/2024  Medication Sig   ALPRAZolam (XANAX) 0.5 MG tablet Take 0.5 mg by mouth as needed for anxiety (only when she flies.).    cholecalciferol (VITAMIN D3) 25 MCG (1000 UNIT) tablet Take 10,000 Units by mouth 3 (three) times a week.   Multiple Minerals-Vitamins (NUTRA-SUPPORT BONE PO) Take 3 tablets by mouth daily.   NON FORMULARY Take 1 Scoop by mouth daily. Lion Mane Mushroom Extract Power   Omega-3 Fatty Acids (OMEGA-3 FISH OIL PO) Take 2 tablets by mouth daily.   Zinc Acetate, Oral, (ZINC ACETATE PO) Take 1 tablet by mouth daily. (Patient taking differently: Take 1 tablet by mouth daily. During winter months only)   Ascorbic Acid (VITAMIN C) 1000 MG tablet Take 1,000 mg by mouth daily. (Patient not taking: Reported on 08/31/2024)   Probiotic Product (PROBIOTIC ADVANCED PO) Take 1 tablet by mouth daily. (Patient not taking: Reported on 08/31/2024)   No facility-administered encounter medications on file as of 08/31/2024.    PAST MEDICAL HISTORY: Past Medical History:  Diagnosis Date   Anemia    Bronchitis    Cold    getting over a cold   Complication of anesthesia    Dysrhythmia    LBBB   Dysrhythmia, cardiac    heart races at times, tachycardia   GERD (gastroesophageal reflux disease)    at times   H/O multiple allergies    Headache  sinus and migraines   Osteoarthritis    PONV (postoperative nausea and vomiting)     PAST SURGICAL HISTORY: Past Surgical History:  Procedure Laterality Date   FRACTURE SURGERY Right 2009   ORIF arm   INTRAUTERINE DEVICE INSERTION      ALLERGIES: No Known Allergies  FAMILY HISTORY: Family History  Problem Relation Age of Onset   Migraines Mother    COPD Mother     Heart attack Father    Hypertension Father    Ulcers Father    Alcoholism Father    Asthma Sister    Aneurysm Paternal Aunt    Alzheimer's disease Maternal Grandmother    Cervical cancer Maternal Grandmother    Aneurysm Paternal Grandmother    Heart disease Paternal Grandfather    Breast cancer Cousin        19s   Breast cancer Cousin        46s    SOCIAL HISTORY: Social History   Tobacco Use   Smoking status: Never   Smokeless tobacco: Never  Vaping Use   Vaping status: Never Used  Substance Use Topics   Alcohol use: Yes    Comment: ocassionally   Drug use: Not Currently    Types: Marijuana   Social History   Social History Narrative   Caffeine very little, children 2, Employed Runner, broadcasting/film/video at [employer].    Are you right handed or left handed? right   Do you live at home alone?yes   Who lives with you?    What type of home do you live in: 1 story or 2 story? two    Caffiene 3 green tea, diet pepsi      OBJECTIVE: PHYSICAL EXAM: BP 130/76   Pulse 82   Ht 5' 7 (1.702 m)   Wt 166 lb (75.3 kg)   LMP 10/13/2014   SpO2 98%   BMI 26.00 kg/m   General: General appearance: Awake and alert. No distress. Cooperative with exam.  Skin: No obvious rash or jaundice. HEENT: Atraumatic. Anicteric. Lungs: Non-labored breathing on room air  Heart: Regular Extremities: No edema. Arthritic changes in bilateral hands Psych: Affect appropriate.  Neurological: Mental Status: Alert. Speech fluent. No pseudobulbar affect Cranial Nerves: CNII: No RAPD. Visual fields grossly intact. CNIII, IV, VI: PERRL. No nystagmus. EOMI. CN V: Facial sensation intact bilaterally to fine touch. Masseter clench strong. Jaw jerk negative. CN VII: Facial muscles symmetric and strong. No ptosis at rest. CN VIII: Hearing grossly intact bilaterally. CN IX: No hypophonia. CN X: Palate elevates symmetrically. CN XI: Full strength shoulder shrug bilaterally. CN XII: Tongue protrusion full and  midline. No atrophy or fasciculations. No significant dysarthria Motor: Tone is normal. No fasciculations in any extremities. No significant atrophy  Individual muscle group testing (MRC grade out of 5):  Movement     Neck flexion 5    Neck extension 5     Right Left   Shoulder abduction 5 5   Shoulder adduction 5 5   Shoulder ext rotation 5 5   Shoulder int rotation 5 5   Elbow flexion 5 5   Elbow extension 5 5   Finger abduction - FDI 5 5   Finger abduction - ADM 5 5   Finger extension 5 5   Finger distal flexion - 2/3 5 5    Finger distal flexion - 4/5 5 5    Thumb flexion - FPL 5 5   Thumb abduction - APB 5- 4+    Hip  flexion 5 5   Hip extension 5 5   Hip adduction 5 5   Hip abduction 5 5   Knee extension 5 5   Knee flexion 5 5   Dorsiflexion 5 5   Plantarflexion 5 5    Reflexes:  Right Left   Bicep 2+ 2+   Tricep 2+ 2+   BrRad 2+ 2+   Knee 2+ 2+   Ankle 2+ 2+    Pathological Reflexes: Babinski: flexor response bilaterally Hoffman: absent bilaterally Troemner: absent bilaterally Facial: absent bilaterally Midline tap: absent Sensation: Pinprick: Intact in all extremities Coordination: Intact finger-to- nose-finger bilaterally. Romberg negative. Gait: Able to rise from chair with arms crossed unassisted. Normal, narrow-based gait. Able to tandem walk. Able to walk on toes and heels.  Lab and Test Review: External labs: 06/01/24: BMP unremarkable CMP significant for MCV 98.6 B12: > 2000 Mg: 2.1 ESR wnl CRP wnl  Imaging/Procedures: MRI brain w/wo contrast (04/25/20): I personally reviewed images and agree with the read below (normal MRI brain). FINDINGS:  No abnormal lesions are seen on diffusion-weighted views to suggest acute ischemia. The cortical sulci, fissures and cisterns are normal in size and appearance. Lateral, third and fourth ventricle are normal in size and appearance. No extra-axial fluid collections are seen. No evidence of mass effect or  midline shift.  No abnormal lesions are seen on post contrast views.     On sagittal views the posterior fossa, pituitary gland and corpus callosum are unremarkable. No evidence of intracranial hemorrhage on gradient-echo views. The orbits and their contents, paranasal sinuses and calvarium are unremarkable.  Intracranial flow voids are present.   IMPRESSION:  Normal MRI brain (with and without).   ASSESSMENT: Alexiya Franqui is a 63 y.o. female who presents for evaluation of muscle twitches and intermittent tingling in legs. She has a relevant medical history of LBB, OA and osteoporosis. Her neurological examination is pertinent for weakness of left APB but otherwise intact. Available diagnostic data is significant for normal MRI brain in 2021 and recent labs that showed B12 > 2000 with unremarkable CMP (MCV 98.6). The etiology of patient's symptoms is currently unclear. She is concerned about ALS, but there is no clear signs of this on my examination. More likely is an electrolyte/vitamin issue, deficiency vs side effect of multiple supplements she is taking. I will get labs to look for treatable causes. I will also get an EMG.  PLAN: -Blood work: ionized Ca, PTH, vit D, B1, B6, folate -Recommend stopping any unnecessary supplements -Discussed EMG. Patient would like to pursue this. Would do left side.  -Return to clinic to be determined  The impression above as well as the plan as outlined below were extensively discussed with the patient who voiced understanding. All questions were answered to their satisfaction.  When available, results of the above investigations and possible further recommendations will be communicated to the patient via telephone/MyChart. Patient to call office if not contacted after expected testing turnaround time.   Total time spent reviewing records, interview, history/exam, documentation, and coordination of care on day of encounter:  70 min   Thank you for  allowing me to participate in patient's care.  If I can answer any additional questions, I would be pleased to do so.  Venetia Potters, MD   CC: Valentin Skates, DO [address] [place] [address]  CC: Referring provider: Valentin Skates, DO [address] [place],  [place] [address]

## 2024-08-31 ENCOUNTER — Ambulatory Visit: Admitting: Neurology

## 2024-08-31 ENCOUNTER — Other Ambulatory Visit

## 2024-08-31 ENCOUNTER — Encounter: Payer: Self-pay | Admitting: Neurology

## 2024-08-31 VITALS — BP 130/76 | HR 82 | Ht 67.0 in | Wt 166.0 lb

## 2024-08-31 DIAGNOSIS — R5383 Other fatigue: Secondary | ICD-10-CM

## 2024-08-31 DIAGNOSIS — R202 Paresthesia of skin: Secondary | ICD-10-CM | POA: Diagnosis not present

## 2024-08-31 DIAGNOSIS — R253 Fasciculation: Secondary | ICD-10-CM

## 2024-08-31 NOTE — Patient Instructions (Signed)
 I saw you today for muscle twitches and intermittent numbness and tingling.  I am not sure the cause of your symptoms, but the most likely cause is electrolyte or vitamin problems or side effects to your supplements.  I would like to get blood work today. I will be in touch when I have your results.  I recommend you stop any unnecessary supplements (for good).  We will do nerve testing called EMG (see more information below). We will discuss next steps after.  The physicians and staff at [facility] are committed to providing excellent care. You may receive a survey requesting feedback about your experience at our office. We strive to receive very good responses to the survey questions. If you feel that your experience would prevent you from giving the office a very good  response, please contact our office to try to remedy the situation. We may be reached at [phone]. Thank you for taking the time out of your busy day to complete the survey.  Venetia Potters, MD [facility]  ELECTROMYOGRAM AND NERVE CONDUCTION STUDIES (EMG/NCS) INSTRUCTIONS  How to Prepare The neurologist conducting the EMG will need to know if you have certain medical conditions. Tell the neurologist and other EMG lab personnel if you: Have a pacemaker or any other electrical medical device Take blood-thinning medications Have hemophilia, a blood-clotting disorder that causes prolonged bleeding Bathing Take a shower or bath shortly before your exam in order to remove oils from your skin. Don't apply lotions or creams before the exam.  What to Expect You'll likely be asked to change into a hospital gown for the procedure and lie down on an examination table. The following explanations can help you understand what will happen during the exam.  Electrodes. The neurologist or a technician places surface electrodes at various locations on your skin depending on where you're experiencing symptoms. Or the  neurologist may insert needle electrodes at different sites depending on your symptoms.  Sensations. The electrodes will at times transmit a tiny electrical current that you may feel as a twinge or spasm. The needle electrode may cause discomfort or pain that usually ends shortly after the needle is removed. If you are concerned about discomfort or pain, you may want to talk to the neurologist about taking a short break during the exam.  Instructions. During the needle EMG, the neurologist will assess whether there is any spontaneous electrical activity when the muscle is at rest - activity that isn't present in healthy muscle tissue - and the degree of activity when you slightly contract the muscle.  He or she will give you instructions on resting and contracting a muscle at appropriate times. Depending on what muscles and nerves the neurologist is examining, he or she may ask you to change positions during the exam.  After your EMG You may experience some temporary, minor bruising where the needle electrode was inserted into your muscle. This bruising should fade within several days. If it persists, contact your primary care doctor.

## 2024-09-05 ENCOUNTER — Ambulatory Visit: Payer: Self-pay | Admitting: Neurology

## 2024-09-05 LAB — CALCIUM, IONIZED: Calcium, Ion: 5.2 mg/dL (ref 4.7–5.5)

## 2024-09-05 LAB — VITAMIN B6: Vitamin B6: 10.7 ng/mL (ref 2.1–21.7)

## 2024-09-05 LAB — PARATHYROID HORMONE, INTACT (NO CA): PTH: 41 pg/mL (ref 16–77)

## 2024-09-05 LAB — FOLATE: Folate: 10.3 ng/mL

## 2024-09-05 LAB — VITAMIN D 25 HYDROXY (VIT D DEFICIENCY, FRACTURES): Vit D, 25-Hydroxy: 61 ng/mL (ref 30–100)

## 2024-09-05 LAB — VITAMIN B1: Vitamin B1 (Thiamine): 13 nmol/L (ref 8–30)

## 2024-09-06 NOTE — Telephone Encounter (Signed)
 Pt cld back for results

## 2024-10-31 ENCOUNTER — Telehealth: Payer: Self-pay | Admitting: Neurology

## 2024-10-31 ENCOUNTER — Ambulatory Visit: Admitting: Neurology

## 2024-10-31 DIAGNOSIS — R253 Fasciculation: Secondary | ICD-10-CM | POA: Diagnosis not present

## 2024-10-31 NOTE — Procedures (Signed)
 [facility]  [address], [address]  [place], [place] [address] Tel: [phone] Fax: [phone] Test Date:  10/31/2024  Patient: Jenna Morgan DOB: 12-08-1961 Physician: Venetia Potters, MD  Sex: Female Height: 5' 7 Ref Phys: Venetia Potters, MD  ID#: [id number]   Technician:    History: This is a 63 year old female with muscle twitching.  NCV & EMG Findings: Extensive electrodiagnostic evaluation of the left upper and lower limbs shows: Left sural, superficial peroneal/fibular, median, ulnar, and radial sensory responses are within normal limits. Left median (APB) motor response shows reduced amplitude (4.6 mV). Left peroneal/fibular (EDB), tibial (AH), and ulnar (ADM) motor responses are within normal limits. Left H reflex latency is within normal limits. There is no evidence of active or chronic motor axon loss changes affecting any of the tested muscles on needle examination, including no fasciculations appreciated in any muscle tested. Motor unit configuration and recruitment pattern is within normal limits.  Impression: This study shows no significant abnormalities. Specifically: No electrodiagnostic evidence of a large fiber sensorimotor neuropathy. No electrodiagnostic evidence of a left cervical (C5-T1) or lumbosacral (L3-S1) motor radiculopathy. No electrodiagnostic evidence of a disorder of anterior horn cells, such as ALS. Low amplitude left median (APB) motor response is of unclear significance, particularly given the normal needle examination of the same muscle. This finding may be technical, but in any case would be too limited in degree and distribution for diagnostic purposes.    ___________________________ Venetia Potters, MD    Nerve Conduction Studies Motor Nerve Results    Latency Amplitude F-Lat Segment Distance CV Comment  Site (ms) Norm (mV) Norm (ms)  (cm) (m/s) Norm   Left Fibular (EDB) Motor  Ankle 3.9  < 6.0 2.9  > 2.5        Bel fib  head 10.6 - 2.6 -  Bel fib head-Ankle 30.5 46  > 40   Pop fossa 12.5 - 2.6 -  Pop fossa-Bel fib head 10 53 -   Left Median (APB) Motor  Wrist 3.2  < 4.0 *4.6  > 5.0        Elbow 8.2 - 3.6 -  Elbow-Wrist 26.5 53  > 50   Post exercise 3.1 - 4.5 -        Left Tibial (AH) Motor  Ankle 3.3  < 6.0 11.4  > 4.0        Knee 12.0 - 11.1 -  Knee-Ankle - -  > 40   Left Ulnar (ADM) Motor  Wrist 2.1  < 3.1 8.9  > 7.0        Bel elbow 6.0 - 8.6 -  Bel elbow-Wrist 21 54  > 50   Ab elbow 8.0 - 7.1 -  Ab elbow-Bel elbow 10 50 -    Sensory Sites    Neg Peak Lat Amplitude (O-P) Segment Distance Velocity Comment  Site (ms) Norm (V) Norm  (cm) (ms)   Left Median Sensory  Wrist-Dig II 3.1  < 3.8 34  > 10 Wrist-Dig II 13    Left Radial Sensory  Forearm-Wrist 1.98  < 2.8 30  > 10 Forearm-Wrist 10    Left Superficial Fibular Sensory  14 cm-Ankle 2.7  < 4.6 5  > 3 14 cm-Ankle 14    Left Sural Sensory  Calf-Lat mall 3.3  < 4.6 5  > 3 Calf-Lat mall 14    Left Ulnar Sensory  Wrist-Dig V 2.7  < 3.2 16  > 5 Wrist-Dig  V 11     H-Reflex Results    M-Lat H Lat H Neg Amp H-M Lat  Site (ms) (ms) Norm (mV) (ms)  Left Tibial H-Reflex  Pop fossa 6.0 32.7  < 35.0 1.60 26.7   Electromyography   Side Muscle Ins.Act Fibs Fasc Recrt Amp Dur Poly Activation Comment  Left Tib ant Nml Nml Nml Nml Nml Nml Nml Nml N/A  Left Gastroc MH Nml Nml Nml Nml Nml Nml Nml Nml N/A  Left Vastus lat Nml Nml Nml Nml Nml Nml Nml Nml N/A  Left Biceps fem SH Nml Nml Nml Nml Nml Nml Nml Nml N/A  Left Gluteus med Nml Nml Nml Nml Nml Nml Nml Nml N/A  Left FDI Nml Nml Nml Nml Nml Nml Nml Nml N/A  Left EIP Nml Nml Nml Nml Nml Nml Nml Nml N/A  Left Pronator teres Nml Nml Nml Nml Nml Nml Nml Nml N/A  Left APB Nml Nml Nml Nml Nml Nml Nml Nml N/A  Left Biceps Nml Nml Nml Nml Nml Nml Nml Nml N/A  Left Triceps lat hd Nml Nml Nml Nml Nml Nml Nml Nml N/A  Left Deltoid Nml Nml Nml Nml Nml Nml Nml Nml N/A      Waveforms:  Motor            Sensory             H-Reflex

## 2024-10-31 NOTE — Telephone Encounter (Signed)
 Discussed the results of patient's EMG after the procedure today. EMG today was essentially normal. There is no clear muscle or nerve pathology seen to explain muscle twitches. I did not see any during exam today either.  I explained that etiology is likely benign. Patient will follow up with me as needed as I am happy to re-evaluate with new or worsening symptoms.  All questions were answered.  Venetia Potters, MD [facility]

## 2025-01-04 ENCOUNTER — Other Ambulatory Visit: Payer: Self-pay | Admitting: Internal Medicine

## 2025-01-04 DIAGNOSIS — E785 Hyperlipidemia, unspecified: Secondary | ICD-10-CM

## 2025-01-20 ENCOUNTER — Ambulatory Visit
Admission: RE | Admit: 2025-01-20 | Discharge: 2025-01-20 | Disposition: A | Source: Ambulatory Visit | Attending: Internal Medicine

## 2025-01-20 DIAGNOSIS — E785 Hyperlipidemia, unspecified: Secondary | ICD-10-CM
